# Patient Record
Sex: Female | Born: 1950 | Race: White | Hispanic: No | Smoking: Never smoker
Health system: Southern US, Community
[De-identification: ages and names within clinical notes are randomized; demographics above are authoritative.]

## PROBLEM LIST (undated history)

## (undated) DIAGNOSIS — E119 Type 2 diabetes mellitus without complications: Secondary | ICD-10-CM

## (undated) HISTORY — DX: Type 2 diabetes mellitus without complications: E11.9

---

## 1998-01-27 ENCOUNTER — Ambulatory Visit: Admission: RE | Admit: 1998-01-27 | Discharge: 1998-01-27 | Payer: Self-pay

## 1998-04-24 ENCOUNTER — Ambulatory Visit (HOSPITAL_COMMUNITY): Admission: RE | Admit: 1998-04-24 | Discharge: 1998-04-24 | Payer: Self-pay | Admitting: Family Medicine

## 1998-04-24 ENCOUNTER — Encounter: Payer: Self-pay | Admitting: Family Medicine

## 1998-05-16 ENCOUNTER — Ambulatory Visit (HOSPITAL_COMMUNITY): Admission: RE | Admit: 1998-05-16 | Discharge: 1998-05-16 | Payer: Self-pay | Admitting: Family Medicine

## 1998-05-30 ENCOUNTER — Ambulatory Visit (HOSPITAL_COMMUNITY): Admission: RE | Admit: 1998-05-30 | Discharge: 1998-05-30 | Payer: Self-pay | Admitting: Family Medicine

## 1998-05-31 ENCOUNTER — Encounter: Payer: Self-pay | Admitting: Family Medicine

## 1998-07-22 ENCOUNTER — Encounter: Payer: Self-pay | Admitting: Family Medicine

## 1998-07-22 ENCOUNTER — Ambulatory Visit (HOSPITAL_COMMUNITY): Admission: RE | Admit: 1998-07-22 | Discharge: 1998-07-22 | Payer: Self-pay | Admitting: Family Medicine

## 1998-10-18 ENCOUNTER — Encounter: Payer: Self-pay | Admitting: Family Medicine

## 1998-10-18 ENCOUNTER — Ambulatory Visit (HOSPITAL_COMMUNITY): Admission: RE | Admit: 1998-10-18 | Discharge: 1998-10-18 | Payer: Self-pay | Admitting: Family Medicine

## 1998-11-21 ENCOUNTER — Ambulatory Visit (HOSPITAL_COMMUNITY): Admission: RE | Admit: 1998-11-21 | Discharge: 1998-11-21 | Payer: Self-pay | Admitting: Family Medicine

## 1998-12-12 ENCOUNTER — Ambulatory Visit (HOSPITAL_COMMUNITY): Admission: RE | Admit: 1998-12-12 | Discharge: 1998-12-12 | Payer: Self-pay | Admitting: Family Medicine

## 1998-12-12 ENCOUNTER — Encounter: Payer: Self-pay | Admitting: Family Medicine

## 1999-02-24 ENCOUNTER — Ambulatory Visit (HOSPITAL_COMMUNITY): Admission: RE | Admit: 1999-02-24 | Discharge: 1999-02-24 | Payer: Self-pay | Admitting: Family Medicine

## 1999-02-24 ENCOUNTER — Encounter: Payer: Self-pay | Admitting: Family Medicine

## 1999-05-22 ENCOUNTER — Encounter: Payer: Self-pay | Admitting: Family Medicine

## 1999-05-22 ENCOUNTER — Ambulatory Visit (HOSPITAL_COMMUNITY): Admission: RE | Admit: 1999-05-22 | Discharge: 1999-05-22 | Payer: Self-pay | Admitting: Family Medicine

## 2000-05-11 ENCOUNTER — Ambulatory Visit (HOSPITAL_COMMUNITY): Admission: RE | Admit: 2000-05-11 | Discharge: 2000-05-11 | Payer: Self-pay | Admitting: *Deleted

## 2000-06-28 ENCOUNTER — Encounter: Admission: RE | Admit: 2000-06-28 | Discharge: 2000-06-28 | Payer: Self-pay | Admitting: Nephrology

## 2000-06-28 ENCOUNTER — Encounter: Payer: Self-pay | Admitting: Nephrology

## 2000-07-05 ENCOUNTER — Encounter: Admission: RE | Admit: 2000-07-05 | Discharge: 2000-10-03 | Payer: Self-pay | Admitting: Nephrology

## 2000-10-28 ENCOUNTER — Encounter: Admission: RE | Admit: 2000-10-28 | Discharge: 2001-01-26 | Payer: Self-pay | Admitting: Nephrology

## 2000-11-03 ENCOUNTER — Encounter: Payer: Self-pay | Admitting: Family Medicine

## 2000-11-03 ENCOUNTER — Encounter: Admission: RE | Admit: 2000-11-03 | Discharge: 2000-11-03 | Payer: Self-pay | Admitting: Family Medicine

## 2000-12-14 ENCOUNTER — Encounter: Admission: RE | Admit: 2000-12-14 | Discharge: 2001-03-14 | Payer: Self-pay

## 2001-03-23 ENCOUNTER — Encounter: Admission: RE | Admit: 2001-03-23 | Discharge: 2001-06-21 | Payer: Self-pay | Admitting: Nephrology

## 2001-05-11 ENCOUNTER — Ambulatory Visit (HOSPITAL_COMMUNITY): Admission: RE | Admit: 2001-05-11 | Discharge: 2001-05-11 | Payer: Self-pay | Admitting: Endocrinology

## 2001-05-31 ENCOUNTER — Ambulatory Visit (HOSPITAL_COMMUNITY): Admission: RE | Admit: 2001-05-31 | Discharge: 2001-05-31 | Payer: Self-pay | Admitting: Endocrinology

## 2001-05-31 ENCOUNTER — Encounter: Payer: Self-pay | Admitting: Endocrinology

## 2001-06-06 ENCOUNTER — Ambulatory Visit (HOSPITAL_COMMUNITY): Admission: RE | Admit: 2001-06-06 | Discharge: 2001-06-06 | Payer: Self-pay | Admitting: Endocrinology

## 2001-06-06 ENCOUNTER — Encounter: Payer: Self-pay | Admitting: Endocrinology

## 2001-07-15 ENCOUNTER — Encounter: Admission: RE | Admit: 2001-07-15 | Discharge: 2001-10-13 | Payer: Self-pay | Admitting: Nephrology

## 2001-08-29 ENCOUNTER — Encounter: Payer: Self-pay | Admitting: Family Medicine

## 2001-08-29 ENCOUNTER — Ambulatory Visit (HOSPITAL_COMMUNITY): Admission: RE | Admit: 2001-08-29 | Discharge: 2001-08-29 | Payer: Self-pay | Admitting: Family Medicine

## 2003-03-30 ENCOUNTER — Encounter: Admission: RE | Admit: 2003-03-30 | Discharge: 2003-03-30 | Payer: Self-pay | Admitting: Family Medicine

## 2003-03-30 ENCOUNTER — Encounter: Payer: Self-pay | Admitting: Family Medicine

## 2003-04-05 ENCOUNTER — Encounter: Payer: Self-pay | Admitting: Family Medicine

## 2003-04-05 ENCOUNTER — Encounter: Admission: RE | Admit: 2003-04-05 | Discharge: 2003-04-05 | Payer: Self-pay | Admitting: Family Medicine

## 2004-10-22 ENCOUNTER — Encounter: Admission: RE | Admit: 2004-10-22 | Discharge: 2004-10-22 | Payer: Self-pay | Admitting: Family Medicine

## 2004-10-28 ENCOUNTER — Encounter: Admission: RE | Admit: 2004-10-28 | Discharge: 2004-10-28 | Payer: Self-pay | Admitting: Family Medicine

## 2004-10-29 ENCOUNTER — Encounter: Admission: RE | Admit: 2004-10-29 | Discharge: 2004-10-29 | Payer: Self-pay | Admitting: Family Medicine

## 2006-01-11 ENCOUNTER — Encounter: Admission: RE | Admit: 2006-01-11 | Discharge: 2006-01-11 | Payer: Self-pay | Admitting: Family Medicine

## 2007-02-02 ENCOUNTER — Encounter: Admission: RE | Admit: 2007-02-02 | Discharge: 2007-02-02 | Payer: Self-pay | Admitting: Family Medicine

## 2008-03-02 ENCOUNTER — Encounter: Admission: RE | Admit: 2008-03-02 | Discharge: 2008-03-02 | Payer: Self-pay | Admitting: Family Medicine

## 2008-04-23 ENCOUNTER — Encounter: Admission: RE | Admit: 2008-04-23 | Discharge: 2008-04-23 | Payer: Self-pay | Admitting: Family Medicine

## 2010-01-01 ENCOUNTER — Encounter: Admission: RE | Admit: 2010-01-01 | Discharge: 2010-01-01 | Payer: Self-pay | Admitting: Family Medicine

## 2010-09-29 ENCOUNTER — Encounter: Payer: Self-pay | Admitting: Family Medicine

## 2014-12-20 ENCOUNTER — Ambulatory Visit (HOSPITAL_COMMUNITY): Payer: Medicare Other | Attending: Family Medicine | Admitting: Physical Therapy

## 2014-12-20 DIAGNOSIS — I890 Lymphedema, not elsewhere classified: Secondary | ICD-10-CM

## 2014-12-20 NOTE — Therapy (Signed)
[place], [place], [address] Phone: [phone]   Fax:  [phone]  Patient Details  Name: NIDYA BOUYER MRN: [id number] Date of Birth: 1951/09/04 Referring Provider:  Christain Sacramento, MD  Encounter Date: 12/20/2014 Rayetta Humphrey, PT CLT [phone] 12/20/2014, 11:05 AM   Pt to department with referral for lymphedema treatment.  Pt states she has been receiving lymphedema treatment from home health services since the end of September of 2015.  At this time the pt is still receiving [facility] services.  Therapist explained to pt that insurance will not cover both [place] and [facility] services.  Pt given the order sheet for short stretch bandages.  Pt is to call back if she decides that she would prefer to have lymphedema services as an out patient and stop home health services.   No charge for this visit.  Rayetta Humphrey, PT CLT [place] [address] [place], [place], [address] Phone: [phone]   Fax:  [phone]

## 2014-12-27 ENCOUNTER — Telehealth (HOSPITAL_COMMUNITY): Payer: Self-pay | Admitting: Physical Therapy

## 2014-12-27 ENCOUNTER — Ambulatory Visit (HOSPITAL_COMMUNITY): Payer: Medicare Other | Admitting: Physical Therapy

## 2014-12-27 DIAGNOSIS — I890 Lymphedema, not elsewhere classified: Secondary | ICD-10-CM

## 2014-12-27 NOTE — Therapy (Signed)
[place], [place], [address] Phone: [phone]   Fax:  [phone]  Physical Therapy Evaluation  Patient Details  Name: Stacey Miller MRN: [id number] Date of Birth: 1950-11-13 Referring Provider:  Christain Sacramento, MD  Encounter Date: 12/27/2014      PT End of Session - 12/27/14 1152    Visit Number 1   Number of Visits 24   Authorization Type Medicare    PT Start Time 0930   PT Stop Time 1025   PT Time Calculation (min) 55 min   Activity Tolerance Patient tolerated treatment well      No past medical history on file.  No past surgical history on file.  There were no vitals filed for this visit.  Visit Diagnosis:  Lymphedema      Subjective Assessment - 12/27/14 1202    Subjective Stacey Miller states that she has had "Big legs" for as long as she can remeber.  She states she was admitted into the hospital last September with wounds and significant swelling.  When she was discharged she had home health services who were completing wound care and compression wrapping ,(no foam or multilayer).  The wounds went away but her legs continue to swell especially in the thighs but the [facility] nurses stated that they did not know how to wrap her thighs,  Home health stopped seeing Stacey Miller on 12/24/2014 and she is now being referred to out-patient services.    Pertinent History Pt is in renal falure and is on six different diuretic;; pt was hospitalized 5 times last year for various complications of her lymphedema. Pt has pulmonary hypertension and is on oxygen; Pt fx Lt ankle 02/2014 decreased ROM ever since    Limitations Walking   How long can you sit comfortably? no problem   How long can you stand comfortably? less than five minutes    How long can you walk comfortably? five to ten minutes    Patient Stated Goals fluid in her legs to go down    Currently in Pain? No/denies            [facility] PT Assessment - 12/27/14 0001     Assessment   Medical Diagnosis lymphedema   Onset Date 05/22/14   Prior Therapy HH   Precautions   Precautions Other (comment)   Precaution Comments cellulitis    Restrictions   Weight Bearing Restrictions No   Balance Screen   Has the patient fallen in the past 6 months No   Has the patient had a decrease in activity level because of a fear of falling?  Yes   Is the patient reluctant to leave their home because of a fear of falling?  Yes   [place] Private residence   Living Arrangements Other (Comment)   Available Help at Discharge --  Pt has aide with her    Home Access Level entry   [place] One level   Prior Function   Level of Independence Needs assistance with homemaking   Vocation On disability   Leisure reading; tv   Cognition   Overall Cognitive Status Within Functional Limits for tasks assessed       Date 12/27/2014 12/27/2014   RT LT  MTP 23.00 23.00  ankle 25.4 26.2  4cm 26.60 29.00  8cm 32.80 33.90  12 cm 38.00 40.30  16cm 44.00 45.80  20cm 47.00 50.10  24cm 46.70 54.00  28cm 59.50 59.50  32cm 63.20 65.40  36cm 81.60 69.70  40cm 87.50 79.00  44cm 83.40 82.00  48cm    52cm    56cm    58cm    60cm        Sum of squares 39532.31 37993.89  Total Volume 12583.53 37943.276                 [facility] Adult PT Treatment/Exercise - 12/27/14 0001    Manual Therapy   Manual Therapy Edema management   Edema Management foam cut for B LE; LE cleansed, moisturized then wrapped using foam and multilayer compression bandages.                 PT Education - 12/27/14 1150    Education provided Yes   Education Details Pt given sheet for HEP for LE as well as informational sheet on lymphedema; explained to take bandages off if they are bothering her    Person(s) Educated Patient   Methods Explanation;Handout   Comprehension Verbalized understanding          PT Short Term Goals - 12/27/14 1210    PT SHORT  TERM GOAL #1   Title Pt to complete HEP   Time 1   Period Weeks   PT SHORT TERM GOAL #2   Title PT to be able to verbalize the signs and sx of cellulitis and the iimportance of getting antibiotics   Time 2   PT SHORT TERM GOAL #3   Title Pt volume to be decreased by 20%   Time 4   Period Weeks   PT SHORT TERM GOAL #4   Title Pt to be able to verbalize the care of compression bandages   Time 4   Period Weeks           PT Long Term Goals - 12/27/14 1211    PT LONG TERM GOAL #1   Title Pt volume to be decreased by 40%   Time 8   Period Weeks   PT LONG TERM GOAL #2   Title Pt to state she is able to walk for 10-15 minues    Time 8   Period Weeks   PT LONG TERM GOAL #3   Title Pt to be knowledgable of the maintainace stage of lymphedema and where to obtain compression garments   Time 8   Period Weeks               Plan - 12/27/14 1153    Clinical Impression Statement Pt is a 64 yo female with B lymphedema of LE.  She has a complicated hx including end stage renal disease.  Stacey Miller has significant edema in B LE especially in her thigh region.  She has been referred  and will benefit from skilled physical therapy for treatment of her lymphedema.     Pt will benefit from skilled therapeutic intervention in order to improve on the following deficits Obesity;Increased edema   Rehab Potential Good   PT Frequency 3x / week   PT Duration 8 weeks   PT Treatment/Interventions Compression bandaging;Patient/family education;Manual techniques   PT Next Visit Plan Due to end stage kidney failure we will progress slowly;  For the next treatment cut foam but do not wrapp B thigh area.  Complete manual decongestive techniques followed by compression bandaging of B LE to knee level.  Once pt is sure kidneys are doing well 2nd week Totally wrap one LE with the other being  wrapped to knee level only then third  week wrap entire leg B.    PT Home Exercise Plan given    Consulted and Agree  with Plan of Care Patient          G-Codes - 05-Jan-2015 1213    Functional Limitation Other PT primary   Other PT Primary Current Status (E2336) At least 60 percent but less than 80 percent impaired, limited or restricted   Other PT Primary Goal Status (P2244) At least 40 percent but less than 60 percent impaired, limited or restricted       Problem List There are no active problems to display for this patient.  Rayetta Humphrey, PT CLT [phone] 2015/01/05, 12:14 PM  [place] [address] [place], [place], [address] Phone: [phone]   Fax:  [phone]

## 2015-01-03 ENCOUNTER — Ambulatory Visit (HOSPITAL_COMMUNITY): Payer: Medicare Other | Admitting: Physical Therapy

## 2015-01-03 DIAGNOSIS — I890 Lymphedema, not elsewhere classified: Secondary | ICD-10-CM | POA: Diagnosis not present

## 2015-01-03 NOTE — Therapy (Signed)
[place], [place], [address] Phone: [phone]   Fax:  [phone]  Physical Therapy Treatment  Patient Details  Name: Stacey Miller MRN: [id number] Date of Birth: May 09, 1951 Referring Provider:  Christain Sacramento, MD  Encounter Date: 01/03/2015      PT End of Session - 01/03/15 0944    Visit Number 2   Number of Visits 24   Authorization Type Medicare    PT Start Time 0802   PT Stop Time 0930   PT Time Calculation (min) 88 min   Activity Tolerance Patient tolerated treatment well   Behavior During Therapy [facility] for tasks assessed/performed      No past medical history on file.  No past surgical history on file.  There were no vitals filed for this visit.  Visit Diagnosis:  Lymphedema      Subjective Assessment - 01/03/15 0935    Subjective Pt states that the scratches on her legs are from her scratching as well as from her cats.  Therapist explained the importance of keeping legs covered sot that cats will not scratch LE and for pt to rub and not scratch her own LE.  Pt's aide states that the company<([employer]) told her that they diid not carry short stretch bandages and that is why they have medium stretch bandages.  Therapist explained that this is not true and to call and talk to a manager if they need  to but to secure short stretch bandages.    Currently in Pain? No/denies             [facility] Adult PT Treatment/Exercise - 01/03/15 0001    Manual Therapy   Manual Therapy Edema management;Manual Lymphatic Drainage (MLD)   Edema Management foam cut for anterior Thigh B.     Manual Lymphatic Drainage (MLD) supraclavicular; deep and superficial abdominal followed by anterior  LE using inguinal-axillary anastomsis B followed by  Bilateral LE.  Both LE bandaged using foam and medium stretch bandages from foot to knee.                  PT Education - 01/03/15 0941    Education provided  Yes   Education Details Pt informed to avoid scratching LE due to increased risk of cellulitis, pt educated in the importance of keeping bandages clean,(pt comes to department with dirty, unwrapped bandages), The importance of securing short strech instead of medium stretch bandages and emphasised that after treatment pt needs to be willing to wear some type of compression bandaging on a daily basis.    Person(s) Educated Patient   Methods Explanation   Comprehension Verbalized understanding          PT Short Term Goals - 01/03/15 0948    PT SHORT TERM GOAL #1   Title Pt to complete HEP   Period Weeks   Status On-going   PT SHORT TERM GOAL #2   Title PT to be able to verbalize the signs and sx of cellulitis and the iimportance of getting antibiotics   Time 2   Period Weeks   PT SHORT TERM GOAL #3   Title Pt volume to be decreased by 20%   Time 4   Period Weeks   Status On-going           PT Long Term Goals - 01/03/15 0949    PT LONG TERM GOAL #1   Title Pt volume to be decreased by 40%  Time 8   Period Weeks   Status On-going   PT LONG TERM GOAL #2   Title Pt to state she is able to walk for 10-15 minues    Time 8   Period Weeks   Status On-going   PT LONG TERM GOAL #3   Title Pt to be knowledgable of the maintainace stage of lymphedema and where to obtain compression garments   Time 8   Period Weeks   Status On-going               Plan - 01/03/15 0945    Clinical Impression Statement Began manual decongestive techniques.  Will follow pt to make sure that she has not ill effects secondary to CHF and end stage renal failure. Pt advised that she has increased edema in her abdominal area as well and would benefit from compression in this area as well.    PT Next Visit Plan Assess how pt did with manual; cut posterior aspect for thighs but continue to only wrap to knee on next session.  Fourth session may begin wrapping one leg to thigh. Continue with manual  techniques.         Problem List There are no active problems to display for this patient.  Rayetta Humphrey, PT CLT [phone] 01/03/2015, 9:50 AM   [place] [address] [place], [place], [address] Phone: [phone]   Fax:  [phone]

## 2015-01-08 ENCOUNTER — Telehealth (HOSPITAL_COMMUNITY): Payer: Self-pay | Admitting: Physical Therapy

## 2015-01-08 ENCOUNTER — Ambulatory Visit (HOSPITAL_COMMUNITY): Payer: Medicare Other | Admitting: Physical Therapy

## 2015-01-10 ENCOUNTER — Ambulatory Visit (HOSPITAL_COMMUNITY): Payer: Medicare Other | Attending: Family Medicine | Admitting: Physical Therapy

## 2015-01-10 DIAGNOSIS — I890 Lymphedema, not elsewhere classified: Secondary | ICD-10-CM

## 2015-01-10 NOTE — Therapy (Signed)
[place], [place], [address] Phone: [phone]   Fax:  [phone]  Physical Therapy Treatment  Patient Details  Name: Stacey Miller MRN: [id number] Date of Birth: 10-07-1950 Referring Provider:  Christain Sacramento, MD  Encounter Date: 01/10/2015      PT End of Session - 01/10/15 1758    Visit Number 3   Number of Visits 24   Authorization Type Medicare    PT Start Time 1300   PT Stop Time 1435   PT Time Calculation (min) 95 min   Activity Tolerance Patient tolerated treatment well   Behavior During Therapy [facility] for tasks assessed/performed      No past medical history on file.  No past surgical history on file.  There were no vitals filed for this visit.  Visit Diagnosis:  Lymphedema      Subjective Assessment - 01/10/15 1755    Subjective Pt states her short stretch bandages have not come in yet.  Spoke to patients caregiver who states she has ordered them.                         [facility] Adult PT Treatment/Exercise - 01/10/15 1758    Manual Therapy   Manual Therapy Edema management;Manual Lymphatic Drainage (MLD)   Edema Management foam cut for ant/post thighs B.     Manual Lymphatic Drainage (MLD) supraclavicular; deep and superficial abdominal followed by anterior  LE using inguinal-axillary anastomsis B followed by LE.  Both LE bandaged using foam and medium stretch bandages from foot to knee.                  PT Education - 01/10/15 1806    Education provided Yes   Education Details Educated on relationship of lipidema to lymphedema and importance of decreasing weight along with current treatment for optimal results.  pt encouraged to keep cats away from her legs to prevent cellulitis from possible scratches.     Person(s) Educated Patient   Methods Explanation   Comprehension Verbalized understanding          PT Short Term Goals - 01/03/15 0948    PT SHORT TERM GOAL  #1   Title Pt to complete HEP   Period Weeks   Status On-going   PT SHORT TERM GOAL #2   Title PT to be able to verbalize the signs and sx of cellulitis and the iimportance of getting antibiotics   Time 2   Period Weeks   PT SHORT TERM GOAL #3   Title Pt volume to be decreased by 20%   Time 4   Period Weeks   Status On-going           PT Long Term Goals - 01/03/15 0949    PT LONG TERM GOAL #1   Title Pt volume to be decreased by 40%   Time 8   Period Weeks   Status On-going   PT LONG TERM GOAL #2   Title Pt to state she is able to walk for 10-15 minues    Time 8   Period Weeks   Status On-going   PT LONG TERM GOAL #3   Title Pt to be knowledgable of the maintainace stage of lymphedema and where to obtain compression garments   Time 8   Period Weeks   Status On-going  Plan - 01/10/15 1800    Clinical Impression Statement Continued manual decongestive techniques.  Educated patient on lymphedema during manual.  Completed bandaging to bilateral LE's foot to knee using 1/2" foam, medium stretch and 2 layers of 10cm short stretch on each LE. Doubled medium stretch to decrease width for wrapping foot and ankle.   Also completed toe bandaging today.  Pt reported overall comfort following session without noted impairments with gait due to bandaging.    Posterior thigh foam pieces cut to begin use next visit if patient tolerates todays session well.    PT Next Visit Plan Next session may begin wrapping one leg to thigh ( Right as this one is worse). Continue with manual techniques.         Problem List There are no active problems to display for this patient.  Teena Irani, PTA/CLT [phone] 01/10/2015, 6:13 PM  [place] [address] [place], [place], [address] Phone: [phone]   Fax:  [phone]

## 2015-01-14 ENCOUNTER — Ambulatory Visit (HOSPITAL_COMMUNITY): Payer: Medicare Other | Admitting: Physical Therapy

## 2015-01-14 DIAGNOSIS — I890 Lymphedema, not elsewhere classified: Secondary | ICD-10-CM | POA: Diagnosis not present

## 2015-01-14 NOTE — Therapy (Signed)
[place], [place], [address] Phone: [phone]   Fax:  [phone]  Physical Therapy Treatment  Patient Details  Name: Stacey Miller MRN: [id number] Date of Birth: 1950/09/11 Referring Provider:  Christain Sacramento, MD  Encounter Date: 01/14/2015      PT End of Session - 01/14/15 1621    Visit Number 4   Number of Visits 24   Authorization Type Medicare    PT Start Time 2585   PT Stop Time 1605   PT Time Calculation (min) 90 min   Activity Tolerance Patient tolerated treatment well   Behavior During Therapy [facility] for tasks assessed/performed      No past medical history on file.  No past surgical history on file.  There were no vitals filed for this visit.  Visit Diagnosis:  Lymphedema      Subjective Assessment - 01/14/15 1616    Subjective Pt comes today with box of short stretch she received in the mail.  Pt states she is doing well without c/o pain and bandages were comfortable.    Currently in Pain? No/denies                         [facility] Adult PT Treatment/Exercise - 01/14/15 0001    Manual Therapy   Manual Therapy Edema management;Manual Lymphatic Drainage (MLD)   Edema Management for bilateral LE's   Manual Lymphatic Drainage (MLD) supraclavicular; deep and superficial abdominal followed by anterior inguinal-axillary anastomsis B. Lt LE bandaged toes to knee, Rt LE bandaged toes to hip using 1/2" foam and multilayer short stretch bandages.                PT Education - 01/14/15 1624    Education provided Yes   Education Details PT instructed to remove Rt upper thigh bandaging if becomes uncomfortable or has difficulty breathing.    Person(s) Educated Patient   Methods Explanation   Comprehension Verbalized understanding          PT Short Term Goals - 01/03/15 0948    PT SHORT TERM GOAL #1   Title Pt to complete HEP   Period Weeks   Status On-going   PT SHORT  TERM GOAL #2   Title PT to be able to verbalize the signs and sx of cellulitis and the iimportance of getting antibiotics   Time 2   Period Weeks   PT SHORT TERM GOAL #3   Title Pt volume to be decreased by 20%   Time 4   Period Weeks   Status On-going           PT Long Term Goals - 01/03/15 0949    PT LONG TERM GOAL #1   Title Pt volume to be decreased by 40%   Time 8   Period Weeks   Status On-going   PT LONG TERM GOAL #2   Title Pt to state she is able to walk for 10-15 minues    Time 8   Period Weeks   Status On-going   PT LONG TERM GOAL #3   Title Pt to be knowledgable of the maintainace stage of lymphedema and where to obtain compression garments   Time 8   Period Weeks   Status On-going               Plan - 01/14/15 1621    Clinical Impression Statement completed manual decongestive techniques for Bilateral  LE's followed by bandaging Bilateral LE's.  Completed upper Rt thigh in addition to bilateral distal LE's.  PT reported overall comfort.  Good results with toe bandaging so continued this as well.  Pt instructed to remove upper thigh bandages if increases pain or becomes difficult to breathe.       PT Next Visit Plan Continue with manual techniques.  Remeasure next visit.         Problem List There are no active problems to display for this patient.   Teena Irani, PTA/CLT [phone] 01/14/2015, 4:25 PM  [place] [address] [place], [place], [address] Phone: [phone]   Fax:  [phone]

## 2015-01-16 ENCOUNTER — Ambulatory Visit (HOSPITAL_COMMUNITY): Payer: Medicare Other | Admitting: Physical Therapy

## 2015-01-16 DIAGNOSIS — I890 Lymphedema, not elsewhere classified: Secondary | ICD-10-CM | POA: Diagnosis not present

## 2015-01-16 NOTE — Therapy (Signed)
[place], [place], [address] Phone: [phone]   Fax:  [phone]  Physical Therapy Treatment  Patient Details  Name: Stacey Miller MRN: [id number] Date of Birth: [birth year] Referring Provider:  Christain Sacramento, MD  Encounter Date: 01/16/2015      PT End of Session - 01/16/15 1545    Visit Number 5   Number of Visits 24   Date for PT Re-Evaluation 01/24/15   Authorization Type Medicare    Authorization - Visit Number 5   Authorization - Number of Visits 10   PT Start Time 1330   PT Stop Time 1530   PT Time Calculation (min) 120 min   Activity Tolerance Patient tolerated treatment well   Behavior During Therapy [facility] for tasks assessed/performed      No past medical history on file.  No past surgical history on file.  There were no vitals filed for this visit.  Visit Diagnosis:  Lymphedema      Subjective Assessment - 01/16/15 1559    Subjective Pt states the bandages were comfortable and the one on her Rt thigh did not slip.  States she can tell her legs are smaller, especially her ankles.    Currently in Pain? No/denies                         [facility] Adult PT Treatment/Exercise - 01/16/15 1554    Manual Therapy   Manual Therapy Edema management;Manual Lymphatic Drainage (MLD)   Edema Management for bilateral LE's   Manual Lymphatic Drainage (MLD) supraclavicular; deep and superficial abdominal followed by anterior inguinal-axillary anastomsis B. Lt LE bandaged toes to knee, Rt LE bandaged toes to hip using 1/2" foam and multilayer short stretch bandages.          right left  MTP 23.00 23.20  ankle 29 30  4cm 24.50 26.50  8cm 25.00 29.70  12 cm 32.20 36.00  16cm 37.50 41.00  20cm 41.60 46.80  24cm 44.60 49.00  28cm 48.00 51.00  32cm 47.00 55.00  36cm 57.00 56.50  40cm 58.50 68.90  44cm 74.00 72.50  48cm 85.00 74.00  52cm 83.50 77.00  56cm 82.00 81.00               Sum of squares 46339.56 47378.53  Total Volume 14750.35 15081.06            PT Education - 01/16/15 1601    Education provided Yes   Education Details Compression tanks and shorts education; given print out of items on [email or web address] website   Person(s) Educated Patient   Methods Explanation;Handout   Comprehension Verbalized understanding          PT Short Term Goals - 01/03/15 0948    PT SHORT TERM GOAL #1   Title Pt to complete HEP   Period Weeks   Status On-going   PT SHORT TERM GOAL #2   Title PT to be able to verbalize the signs and sx of cellulitis and the iimportance of getting antibiotics   Time 2   Period Weeks   PT SHORT TERM GOAL #3   Title Pt volume to be decreased by 20%   Time 4   Period Weeks   Status On-going           PT Long Term Goals - 01/03/15 0949    PT LONG TERM GOAL #1   Title Pt volume to be decreased by  40%   Time 8   Period Weeks   Status On-going   PT LONG TERM GOAL #2   Title Pt to state she is able to walk for 10-15 minues    Time 8   Period Weeks   Status On-going   PT LONG TERM GOAL #3   Title Pt to be knowledgable of the maintainace stage of lymphedema and where to obtain compression garments   Time 8   Period Weeks   Status On-going               Plan - 01/16/15 1548    Clinical Impression Statement Continued with complete decongestive therapy for bilateral LE's.  bilateral LE's remeasured today from MTP to 44cm and measured 3 more locations up to 56 cm since we have began bandaging to thight level.  Pt with reduction of 4,492.61 cc from Rt and 2,731.53 cc from Lt.  this is a total of 7,224.14cc compined equivalent to approx 16 lbs of fluid weight.  According to measurements, Rt thigh is larger than Lt and Lt distal LE is larger than Rt.  Overall patient is responding well to present therapy.  Educated patient on compression shorts and tank tops to help decompress fluid in these areas.  Researched these items on the  [employer] website for patient and printed them for her. continued bandaging entire Rt LE and to knee level only on Lt LE.     PT Next Visit Plan Continue with complete decongestive therapy.  May begin bandaging bilateral LE's to thigh next week if patient is able to tolerate.  continue to measure weekly (Wednesday or Friday).          Teena Irani, PTA/CLT [phone] 01/16/2015, 4:05 PM  [facility] [address] [place], [place], [address] Phone: [phone]   Fax:  [phone]

## 2015-01-21 ENCOUNTER — Ambulatory Visit (HOSPITAL_COMMUNITY): Payer: Medicare Other | Admitting: Physical Therapy

## 2015-01-21 DIAGNOSIS — I890 Lymphedema, not elsewhere classified: Secondary | ICD-10-CM

## 2015-01-21 NOTE — Therapy (Signed)
[place], [place], [address] Phone: [phone]   Fax:  [phone]  Physical Therapy Treatment  Patient Details  Name: Stacey Miller MRN: [id number] Date of Birth: 08/25/1951 Referring Provider:  Christain Sacramento, MD  Encounter Date: 01/21/2015      PT End of Session - 01/21/15 1722    Visit Number 6   Number of Visits 24   Date for PT Re-Evaluation 01/24/15   Authorization Type Medicare    Authorization - Visit Number 6   Authorization - Number of Visits 10   PT Start Time 1400   PT Stop Time 1530   PT Time Calculation (min) 90 min      No past medical history on file.  No past surgical history on file.  There were no vitals filed for this visit.  Visit Diagnosis:  Lymphedema      Subjective Assessment - 01/21/15 1721    Subjective Pt states she is still voiding a lot.              [facility] Adult PT Treatment/Exercise - 01/21/15 0001    Manual Therapy   Manual Therapy Edema management;Manual Lymphatic Drainage (MLD)   Edema Management for bilateral LE's   Manual Lymphatic Drainage (MLD) supraclavicular; deep and superficial abdominal followed by anterior inguinal-axillary anastomsis B. Lt LE bandaged toes to knee, Rt LE bandaged toes to hip using 1/2" foam and multilayer short stretch bandages.              PT Short Term Goals - 01/03/15 0948    PT SHORT TERM GOAL #1   Title Pt to complete HEP   Period Weeks   Status On-going   PT SHORT TERM GOAL #2   Title PT to be able to verbalize the signs and sx of cellulitis and the iimportance of getting antibiotics   Time 2   Period Weeks   PT SHORT TERM GOAL #3   Title Pt volume to be decreased by 20%   Time 4   Period Weeks   Status On-going           PT Long Term Goals - 01/03/15 0949    PT LONG TERM GOAL #1   Title Pt volume to be decreased by 40%   Time 8   Period Weeks   Status On-going   PT LONG TERM GOAL #2   Title Pt  to state she is able to walk for 10-15 minues    Time 8   Period Weeks   Status On-going   PT LONG TERM GOAL #3   Title Pt to be knowledgable of the maintainace stage of lymphedema and where to obtain compression garments   Time 8   Period Weeks   Status On-going               Plan - 01/21/15 1722    Clinical Impression Statement Pt forgot bandages  by the time care taker had returned with bandages there was only time to compression wrap from toes to knees B.    PT Next Visit Plan continue to wrap full LE bilaterally         Problem List There are no active problems to display for this patient. Rayetta Humphrey, PT CLT [phone] 01/21/2015, 5:27 PM  [facility] [address] [place], [place], [address] Phone: [phone]   Fax:  [phone]

## 2015-01-23 ENCOUNTER — Ambulatory Visit (HOSPITAL_COMMUNITY): Payer: Medicare Other | Admitting: Physical Therapy

## 2015-01-25 ENCOUNTER — Ambulatory Visit (HOSPITAL_COMMUNITY): Payer: Medicare Other | Admitting: Physical Therapy

## 2015-01-25 DIAGNOSIS — I890 Lymphedema, not elsewhere classified: Secondary | ICD-10-CM | POA: Diagnosis not present

## 2015-01-25 NOTE — Therapy (Signed)
[place], [place], [address] Phone: [phone]   Fax:  [phone]  Physical Therapy Treatment  Patient Details  Name: Stacey Miller MRN: [id number] Date of Birth: 1951-08-13 Referring Provider:  Christain Sacramento, MD  Encounter Date: 01/25/2015      PT End of Session - 01/25/15 1630    Visit Number 7   Number of Visits 24   Date for PT Re-Evaluation 01/07/15   Authorization Type Medicare    Authorization - Visit Number 7   Authorization - Number of Visits 10   PT Start Time 4081   PT Stop Time 1432   PT Time Calculation (min) 87 min      No past medical history on file.  No past surgical history on file.  There were no vitals filed for this visit.  Visit Diagnosis:  Lymphedema      Subjective Assessment - 01/25/15 1626    Subjective Pt states that she is able to keep her bandages on until the day prior to comimg to therapy.             [facility] Adult PT Treatment/Exercise - 01/25/15 0001    Manual Therapy   Manual Therapy Edema management;Manual Lymphatic Drainage (MLD)   Edema Management for bilateral LE's   Manual Lymphatic Drainage (MLD) supraclavicular; deep and superficial abdominal followed by anterior inguinal-axillary anastomsis B. B LE bandaged toes to hip using...Marland KitchenMarland Kitchen. 1/2" foam and multilayer short stretch bandages.                PT Education - 01/25/15 1628    Education provided Yes   Education Details Again instructed pt on the importance of obtaining compression for her trunk;  Explain the benefit of purchasing a second set of short stretch bandages    Person(s) Educated Patient   Methods Explanation   Comprehension Verbalized understanding          PT Short Term Goals - 01/03/15 0948    PT SHORT TERM GOAL #1   Title Pt to complete HEP   Period Weeks   Status On-going   PT SHORT TERM GOAL #2   Title PT to be able to verbalize the signs and sx of cellulitis and the  iimportance of getting antibiotics   Time 2   Period Weeks   PT SHORT TERM GOAL #3   Title Pt volume to be decreased by 20%   Time 4   Period Weeks   Status On-going           PT Long Term Goals - 01/03/15 0949    PT LONG TERM GOAL #1   Title Pt volume to be decreased by 40%   Time 8   Period Weeks   Status On-going   PT LONG TERM GOAL #2   Title Pt to state she is able to walk for 10-15 minues    Time 8   Period Weeks   Status On-going   PT LONG TERM GOAL #3   Title Pt to be knowledgable of the maintainace stage of lymphedema and where to obtain compression garments   Time 8   Period Weeks   Status On-going               Plan - 01/25/15 1631    Clinical Impression Statement PT with noted decreased swelling from toes to knee area but continues to have fluid from knee to thigh.  Pt verbalized completing LE  exercises to promote lymph drainage at home.    PT Next Visit Plan continue to wrap full LE bilaterally ; Remeasure pt next visit .         Problem List There are no active problems to display for this patient.  Rayetta Humphrey, PT CLT [phone] 01/25/2015, 4:33 PM  [facility] [address] [place], [place], [address] Phone: [phone]   Fax:  [phone]

## 2015-01-29 ENCOUNTER — Ambulatory Visit (HOSPITAL_COMMUNITY): Payer: Medicare Other | Admitting: Physical Therapy

## 2015-01-29 DIAGNOSIS — I890 Lymphedema, not elsewhere classified: Secondary | ICD-10-CM

## 2015-01-29 NOTE — Therapy (Signed)
[place], [place], [address] Phone: [phone]   Fax:  [phone]  Physical Therapy Treatment  Patient Details  Name: Stacey Miller MRN: [id number] Date of Birth: [birth year] Referring Provider:  Christain Sacramento, MD  Encounter Date: 01/29/2015      PT End of Session - 01/29/15 1806    Visit Number 8   Number of Visits 24   Date for PT Re-Evaluation 01/07/15   Authorization Type Medicare    Authorization - Visit Number 8   Authorization - Number of Visits 10   PT Start Time 1638   PT Stop Time 1700   PT Time Calculation (min) 90 min      No past medical history on file.  No past surgical history on file.  There were no vitals filed for this visit.  Visit Diagnosis:  Lymphedema      Subjective Assessment - 01/29/15 1805    Subjective Pt states she removed her bandages last night and took a shower.  STates she had to put one of her cats down this morning and it was hard for her.  currently without pain.   Currently in Pain? No/denies                [facility] Adult PT Treatment/Exercise - 01/29/15 1806    Manual Therapy   Manual Therapy Edema management;Manual Lymphatic Drainage (MLD)   Edema Management for bilateral LE's   Manual Lymphatic Drainage (MLD) supraclavicular; deep and superficial abdominal followed by anterior inguinal-axillary anastomsis B. B LE bandaged toes to hip using...Marland KitchenMarland Kitchen. 1/2" foam and multilayer short stretch bandages.              PT Short Term Goals - 01/03/15 0948    PT SHORT TERM GOAL #1   Title Pt to complete HEP   Period Weeks   Status On-going   PT SHORT TERM GOAL #2   Title PT to be able to verbalize the signs and sx of cellulitis and the iimportance of getting antibiotics   Time 2   Period Weeks   PT SHORT TERM GOAL #3   Title Pt volume to be decreased by 20%   Time 4   Period Weeks   Status On-going           PT Long Term Goals - 01/03/15 0949     PT LONG TERM GOAL #1   Title Pt volume to be decreased by 40%   Time 8   Period Weeks   Status On-going   PT LONG TERM GOAL #2   Title Pt to state she is able to walk for 10-15 minues    Time 8   Period Weeks   Status On-going   PT LONG TERM GOAL #3   Title Pt to be knowledgable of the maintainace stage of lymphedema and where to obtain compression garments   Time 8   Period Weeks   Status On-going               Plan - 01/29/15 1807    Clinical Impression Statement Pt with decongestion of bilateral LE's  with reports of slippage of Rt thigh bandage within a few days.  Pt remains compliant with keeping bandages intact.  PT has not been able to wash bandages this week due to her washing machine being broke.  Pt with very little induration in upper thighs and may be more soft tissue present than actual lymph fluid.  Will measure next visit.    PT Next Visit Plan continue to wrap full LE bilaterally ; Remeasure pt next visit .         Problem List There are no active problems to display for this patient.   Teena Irani, PTA/CLT [phone]  01/29/2015, 6:11 PM  [facility] [address] [place], [place], [address] Phone: [phone]   Fax:  [phone]

## 2015-01-31 ENCOUNTER — Ambulatory Visit (HOSPITAL_COMMUNITY): Payer: Medicare Other | Admitting: Physical Therapy

## 2015-01-31 DIAGNOSIS — I890 Lymphedema, not elsewhere classified: Secondary | ICD-10-CM

## 2015-01-31 NOTE — Therapy (Signed)
[place], [place], [address] Phone: [phone]   Fax:  [phone]  Physical Therapy Treatment  Patient Details  Name: Stacey Miller MRN: [id number] Date of Birth: 1951-06-09 Referring Provider:  Christain Sacramento, MD  Encounter Date: 01/31/2015      PT End of Session - 01/31/15 1811    Visit Number 9   Number of Visits 24   Date for PT Re-Evaluation 01/07/15   Authorization Type Medicare    Authorization - Visit Number 9   Authorization - Number of Visits 10   PT Start Time 1520   PT Stop Time 1710   PT Time Calculation (min) 110 min   Activity Tolerance Patient tolerated treatment well   Behavior During Therapy [facility] for tasks assessed/performed      No past medical history on file.  No past surgical history on file.  There were no vitals filed for this visit.  Visit Diagnosis:  Lymphedema      Subjective Assessment - 01/31/15 1803    Subjective Pt states she just got her washer fixed today so plans on washing her bandages before her next visit. PT also states she has gained 10 lbs of fluid over the past week and plans on taking her high dose fluid pill today   Currently in Pain? No/denies               Date 01/31/2015    right left  MTP 23.00 23.00  ankle 29 30  4cm 26.80 27.20  8cm 27.80 32.10  12 cm 33.60 39.20  16cm 39.00 45.00  20cm 42.50 48.00  24cm 44.50 51.00  28cm 48.00 53.80  32cm 49.60 56.00  36cm 58.80 60.00  40cm 60.00 67.80  44cm 80.20 74.00  48cm 83.20 76.00  52cm 84.60 77.00  56cm 84.60 79.00              Sum of squares 48787.74 49315.17  Total Volume 15529.63 15697.51               [facility] Adult PT Treatment/Exercise - 01/31/15 1810    Manual Therapy   Manual Therapy Edema management;Manual Lymphatic Drainage (MLD)   Edema Management for bilateral LE's   Manual Lymphatic Drainage (MLD) supraclavicular; deep and superficial abdominal followed by  anterior inguinal-axillary anastomsis B. B LE bandaged toes to hip using...Marland KitchenMarland Kitchen. 1/2" foam and multilayer short stretch bandages.                  PT Short Term Goals - 01/31/15 1817    PT SHORT TERM GOAL #1   Title Pt to complete HEP   Time 1   Period Weeks   Status Achieved   PT SHORT TERM GOAL #2   Title PT to be able to verbalize the signs and sx of cellulitis and the iimportance of getting antibiotics   Time 2   Period Weeks   Status Achieved   PT SHORT TERM GOAL #3   Title Pt volume to be decreased by 20%   Time 4   Period Weeks   Status Achieved   PT SHORT TERM GOAL #4   Title Pt to be able to verbalize the care of compression bandages   Time 4   Period Weeks   Status Achieved           PT Long Term Goals - 01/31/15 1817    PT LONG TERM GOAL #1   Title Pt volume  to be decreased by 40%   Time 8   Period Weeks   Status On-going   PT LONG TERM GOAL #2   Title Pt to state she is able to walk for 10-15 minues    Time 8   Period Weeks   Status On-going   PT LONG TERM GOAL #3   Title Pt to be knowledgable of the maintainace stage of lymphedema and where to obtain compression garments   Time 8   Period Weeks   Status On-going               Plan - 01/31/15 1814    Clinical Impression Statement Pt remeasured today with overall increase of 779.28 cc on Rt and 616.45 cc on Lt.  Unsure of cause of fluid gain today, however still much lower than when began therapy.  PT instructed to continue to monitor her weight daily.     PT Next Visit Plan continue to wrap full LE bilaterally ; gcodes next visit.         Teena Irani, PTA/CLT [phone] 01/31/2015, 6:20 PM  [facility] [address] [place], [place], [address] Phone: [phone]   Fax:  [phone]

## 2015-02-06 ENCOUNTER — Telehealth (HOSPITAL_COMMUNITY): Payer: Self-pay

## 2015-02-06 NOTE — Telephone Encounter (Signed)
6/1 called to see if she could come in on 6/3 for Amy to see and she said she had a drs appt and would just see [place] next week

## 2015-02-11 ENCOUNTER — Ambulatory Visit (HOSPITAL_COMMUNITY): Payer: Medicare Other | Attending: Family Medicine | Admitting: Physical Therapy

## 2015-02-11 DIAGNOSIS — I890 Lymphedema, not elsewhere classified: Secondary | ICD-10-CM | POA: Diagnosis present

## 2015-02-11 NOTE — Therapy (Addendum)
[place], [place], [address] Phone: [phone]   Fax:  [phone]  Physical Therapy Treatment / Dareen Piano update  Patient Details  Name: Stacey Miller MRN: [id number] Date of Birth: 1950/12/02 Referring Provider:  Christain Sacramento, MD  Encounter Date: 02/11/2015      PT End of Session - 02/11/15 1811    Visit Number 10   Number of Visits 24   Date for PT Re-Evaluation 01/28/15   Authorization Type Medicare    Authorization - Visit Number 10   Authorization - Number of Visits 20   PT Start Time 1245   PT Stop Time 1515   PT Time Calculation (min) 80 min   Activity Tolerance Patient tolerated treatment well   Behavior During Therapy [facility] for tasks assessed/performed      No past medical history on file.  No past surgical history on file.  There were no vitals filed for this visit.  Visit Diagnosis:  Lymphedema      Subjective Assessment - 02/11/15 1802    Subjective Pt states she had a lot of other MD appoitnments last week and that is why she couldn't come to therapy.   PT states she continues to gain fluid and doesn't know why.  PT reports she has gained 7# in last 2 weeks.  Also with increased redness around ankles today and several opened areas on bilateral Upper thighs.     Currently in Pain? No/denies                         [facility] Adult PT Treatment/Exercise - 02/11/15 1804    Manual Therapy   Manual Therapy Edema management;Manual Lymphatic Drainage (MLD)   Edema Management for bilateral LE's   Manual Lymphatic Drainage (MLD) Only conncentrationon on inguinal nodes followed by LE bandaged knee using  1/2" foam and multilayer short stretch bandages.                  PT Short Term Goals - 01/31/15 1817    PT SHORT TERM GOAL #1   Title Pt to complete HEP   Time 1   Period Weeks   Status Achieved   PT SHORT TERM GOAL #2   Title PT to be able to verbalize the signs and sx  of cellulitis and the iimportance of getting antibiotics   Time 2   Period Weeks   Status Achieved   PT SHORT TERM GOAL #3   Title Pt volume to be decreased by 20%   Time 4   Period Weeks   Status Achieved   PT SHORT TERM GOAL #4   Title Pt to be able to verbalize the care of compression bandages   Time 4   Period Weeks   Status Achieved           PT Long Term Goals - 01/31/15 1817    PT LONG TERM GOAL #1   Title Pt volume to be decreased by 40%   Time 8   Period Weeks   Status On-going   PT LONG TERM GOAL #2   Title Pt to state she is able to walk for 10-15 minues    Time 8   Period Weeks   Status On-going   PT LONG TERM GOAL #3   Title Pt to be knowledgable of the maintainace stage of lymphedema and where to obtain compression garments   Time 8  Period Weeks   Status On-going               Plan - 02/16/2015 1806    Clinical Impression Statement Pt gaining fluid without know cause, especially into her abdominal region.  Also with noted redness around ankle with minimal warmth.  No streaking or other signs of infection present.  Pt also with area of breakdown on Rt inner thigh, unsure if from skin friction or from bandages.  Area on outside of Lt outer thigh and Rt outer proximal LE.  Dressing with xeroform and medipore tape placed over rt inner thigh wound to prevent skin friction and further irritation.  Pt instructed to go to ED if begins to run a temp, chills, nausea, et,c.  Pt verbalized understanding.  Pt only received shortened version of manual for bilateral LE's and bandages to knee level only.     PT Next Visit Plan continue to wrap full LE bilaterally if swelling is down.  PT returns to MD tomorrow regarding fluid gain.         Problem List There are no active problems to display for this patient.   Teena Irani, PTA/CLT [phone] 2015/02/17, 4:22 PM       G-Codes - 02/16/15 0846    Functional Limitation Other PT primary   Other PT Primary  Current Status [phone]) At least 60 percent but less than 80 percent impaired, limited or restricted   Other PT Primary Goal Status (W1027) At least 40 percent but less than 60 percent impaired, limited or restricted     Deniece Ree PT, DPT [facility] [place], [place], [address] Phone: [phone]   Fax:  [phone]

## 2015-02-13 ENCOUNTER — Ambulatory Visit (HOSPITAL_COMMUNITY): Payer: Medicare Other | Admitting: Physical Therapy

## 2015-02-13 DIAGNOSIS — I890 Lymphedema, not elsewhere classified: Secondary | ICD-10-CM | POA: Diagnosis not present

## 2015-02-13 NOTE — Therapy (Signed)
[facility], [place], [address] Phone: [phone]   Fax:  [phone]  Physical Therapy Treatment  Patient Details  Name: TREVIA NOP MRN: [id number] Date of Birth: March 12, 1951 Referring Provider:  Christain Sacramento, MD  Encounter Date: 02/13/2015      PT End of Session - 02/13/15 1749    Date for PT Re-Evaluation 02/18/15      No past medical history on file.  No past surgical history on file.  There were no vitals filed for this visit.  Visit Diagnosis:  Lymphedema      Subjective Assessment - 02/13/15 1734    Subjective Pt states her legs feel better and MD was not concerned wtih her weight gain in the past 2 weeks.  PT states MD was happy that she was 10# lighter overall since beginning of year.     Currently in Pain? No/denies                         [facility] Adult PT Treatment/Exercise - 02/13/15 1745    Manual Therapy   Manual Therapy Edema management;Manual Lymphatic Drainage (MLD)   Edema Management for bilateral LE's   Manual Lymphatic Drainage (MLD) supraclavicular; deep and superficial abdominal followed by anterior inguinal-axillary anastomsis B. B LE bandaged toes to hip using...Marland KitchenMarland Kitchen. 1/2" foam and multilayer short stretch bandages.                  PT Short Term Goals - 01/31/15 1817    PT SHORT TERM GOAL #1   Title Pt to complete HEP   Time 1   Period Weeks   Status Achieved   PT SHORT TERM GOAL #2   Title PT to be able to verbalize the signs and sx of cellulitis and the iimportance of getting antibiotics   Time 2   Period Weeks   Status Achieved   PT SHORT TERM GOAL #3   Title Pt volume to be decreased by 20%   Time 4   Period Weeks   Status Achieved   PT SHORT TERM GOAL #4   Title Pt to be able to verbalize the care of compression bandages   Time 4   Period Weeks   Status Achieved           PT Long Term Goals - 01/31/15 1817    PT LONG TERM GOAL  #1   Title Pt volume to be decreased by 40%   Time 8   Period Weeks   Status On-going   PT LONG TERM GOAL #2   Title Pt to state she is able to walk for 10-15 minues    Time 8   Period Weeks   Status On-going   PT LONG TERM GOAL #3   Title Pt to be knowledgable of the maintainace stage of lymphedema and where to obtain compression garments   Time 8   Period Weeks   Status On-going               Plan - 02/13/15 1746    Clinical Impression Statement Rsumed full manual lymph drainage for bilateral LE's.  Overall improved skin integrity without broken skin, drainage, redness or heat.  Bandaged bilateral LE's full thigh today.     PT Next Visit Plan continue to complete decongestive therapy.  Re-measure/ re-evaluate and begin assigning KX with charges        Problem List There are  no active problems to display for this patient.   Teena Irani, PTA/CLT [phone]  02/13/2015, 5:54 PM  [facility] [address] [place], [place], [address] Phone: [phone]   Fax:  [phone]

## 2015-02-18 ENCOUNTER — Ambulatory Visit (HOSPITAL_COMMUNITY): Payer: Medicare Other | Admitting: Physical Therapy

## 2015-02-18 DIAGNOSIS — I890 Lymphedema, not elsewhere classified: Secondary | ICD-10-CM | POA: Diagnosis not present

## 2015-02-18 NOTE — Therapy (Signed)
[place], [place], [address] Phone: [phone]   Fax:  [phone]  Physical Therapy Treatment  Patient Details  Name: Stacey Miller MRN: [id number] Date of Birth: 1951-05-14 Referring Provider:  Christain Sacramento, MD  Encounter Date: 02/18/2015      PT End of Session - 02/18/15 1634    Visit Number 12   Number of Visits 24   Date for PT Re-Evaluation 03/20/15   Authorization Type Medicare    Authorization - Visit Number 12   Authorization - Number of Visits 20   PT Start Time 0354   PT Stop Time 1440   PT Time Calculation (min) 92 min   Activity Tolerance Patient tolerated treatment well   Behavior During Therapy [facility] for tasks assessed/performed      No past medical history on file.  No past surgical history on file.  There were no vitals filed for this visit.  Visit Diagnosis:  No diagnosis found.      Subjective Assessment - 02/18/15 1631    Subjective Pt states she fell getting from her wheelchair to her lift chair on Saturday and is very sore.  Pt states she has too many MD appointments and wants to decrease treatment to twice a week.    Currently in Pain? No/denies           Date 12/27/2014 12/27/2014 02/18/2015 02/18/2015   RT LT Rt LT  MTP 23.00 23.00 23 24  ankle 25.4 26.2 29.50 31.00  4cm 26.60 29.00 27.40 29.70  8cm 32.80 33.90 27.50 31.80  12 cm 38.00 40.30 32.90 39.00  16cm 44.00 45.80 40.00 44.90  20cm 47.00 50.10 43.50 50.10  24cm 46.70 54.00 45.70 52.70  28cm 59.50 59.50 49.50 53.30  32cm 63.20 65.40 49.20 58.10  36cm 81.60 69.70 57.00 66.00  40cm 87.50 79.00 61.00 68.20  44cm 83.40 82.00 81.00 77.50  48cm      52cm      56cm      58cm      60cm            Sum of squares 39532.31 37993.89 27971.30 33484.63  Total Volume 12583.53 65681.275 8903.5445 10658.493    Rt LE total volume loss since initial treatment 3,680 cc; Lt 1435 cc.          [facility] Adult PT  Treatment/Exercise - 02/18/15 0001    Manual Therapy   Manual Therapy Edema management;Manual Lymphatic Drainage (MLD)   Edema Management for bilateral LE's   Manual Lymphatic Drainage (MLD) supraclavicular; deep and superficial abdominal followed by anterior inguinal-axillary anastomsis B. B LE bandaged toes to hip using...Marland KitchenMarland Kitchen. 1/2" foam and multilayer short stretch bandages.            PT Education - 02/18/15 1633    Education provided Yes   Education Details Pt instructed that it is not good for her to stay in her lift chair all day as she has been; encouaged to complete sitting exercises as well as a walking program even if the walking is in the home.  Pt encouraged to obtain compression for abdominal.           PT Short Term Goals - 02/18/15 1643    PT SHORT TERM GOAL #1   Title Pt to complete HEP   Time 1   Period Weeks   Status Partially Met  given but pt is not consistant   PT SHORT TERM GOAL #2  Title PT to be able to verbalize the signs and sx of cellulitis and the iimportance of getting antibiotics   Time 2   Period Weeks   Status Achieved   PT SHORT TERM GOAL #3   Time 4   Period Weeks   Status Achieved   PT SHORT TERM GOAL #4   Title Pt to be able to verbalize the care of compression bandages   Time 4   Period Weeks   Status Achieved           PT Long Term Goals - 02/18/15 1644    PT LONG TERM GOAL #1   Title Pt volume to be decreased by 40%   Time 8   Period Weeks   Status On-going   PT LONG TERM GOAL #2   Title Pt to state she is able to walk for 10-15 minues    Time 8   Period Weeks   Status On-going   PT LONG TERM GOAL #3   Title Pt to be knowledgable of the maintainace stage of lymphedema and where to obtain compression garments   Time 8   Period Weeks   Status On-going               Plan - 02/18/15 1635    Clinical Impression Statement Pt still has redness from blisters that occured with last weeks wrapping but overall  improving.  Therapist explaimed to pt that normal frequency of manual lymph drainage is 4 to 5 times a week and decreasing to twice a week may result in not having any additional fluid loss which would result in discharge.  Pt verbalized understanding.  Pt had fluid pills that she takes reduced by MD secondary to lab work.  Recommend continued manual decongestive techniques followed by compression bandaging,.   Pt will benefit from skilled therapeutic intervention in order to improve on the following deficits Obesity;Increased edema   Rehab Potential Good   PT Frequency 3x / week   PT Duration 12 weeks  4 additional weeks.     PT Treatment/Interventions Compression bandaging;Patient/family education;Manual techniques   PT Next Visit Plan send MD orders for compression garment as well as reidsleeves. Encourage pt to exercise during commercial breaks as well as walk using a timer.  Begin with 2' progress until pt can walk 10' without stopping.    Consulted and Agree with Plan of Care Patient        Problem List There are no active problems to display for this patient.  Rayetta Humphrey, PT CLT [phone] 02/18/2015, 4:45 PM  [facility] [address] [place], [place], [address] Phone: [phone]   Fax:  [phone]

## 2015-02-19 ENCOUNTER — Telehealth (HOSPITAL_COMMUNITY): Payer: Self-pay | Admitting: Physical Therapy

## 2015-02-19 NOTE — Telephone Encounter (Signed)
Patient requested that I tell Stacey Miller that she wants to keep coming 3x week for Lymphedema treatment.

## 2015-02-20 ENCOUNTER — Encounter (HOSPITAL_COMMUNITY): Payer: Medicare Other | Admitting: Physical Therapy

## 2015-02-22 ENCOUNTER — Ambulatory Visit (HOSPITAL_COMMUNITY): Payer: Medicare Other | Admitting: Physical Therapy

## 2015-02-22 DIAGNOSIS — I890 Lymphedema, not elsewhere classified: Secondary | ICD-10-CM | POA: Diagnosis not present

## 2015-02-22 NOTE — Therapy (Signed)
[place], [place], [address] Phone: [phone]   Fax:  [phone]  Physical Therapy Treatment  Patient Details  Name: CATHEY FREDENBURG MRN: [id number] Date of Birth: [birth year] Referring Provider:  Christain Sacramento, MD  Encounter Date: 02/22/2015      PT End of Session - 02/22/15 1733    Visit Number 13   Number of Visits 24   Date for PT Re-Evaluation 03/20/15   Authorization Type Medicare    Authorization - Visit Number 13   Authorization - Number of Visits 20   PT Start Time 1300   PT Stop Time 1430   PT Time Calculation (min) 90 min   Activity Tolerance Patient tolerated treatment well      No past medical history on file.  No past surgical history on file.  There were no vitals filed for this visit.  Visit Diagnosis:  Lymphedema      Subjective Assessment - 02/22/15 1731    Subjective Pt states every time she comes to therapy she is needing to void more.  Pt states her ankles are the smallest she can remember them being therefore she desires to continue 3x a week                [facility] Adult PT Treatment/Exercise - 02/22/15 0001    Manual Therapy   Manual Therapy Edema management;Manual Lymphatic Drainage (MLD)   Edema Management for bilateral LE's   Manual Lymphatic Drainage (MLD) supraclavicular; deep and superficial abdominal followed by anterior inguinal-axillary anastomsis B. B LE bandaged toes to hip using...Marland KitchenMarland Kitchen. 1/2" foam and multilayer short stretch bandages.            PT Short Term Goals - 02/18/15 1643    PT SHORT TERM GOAL #1   Title Pt to complete HEP   Time 1   Period Weeks   Status Partially Met  given but pt is not consistant   PT SHORT TERM GOAL #2   Title PT to be able to verbalize the signs and sx of cellulitis and the iimportance of getting antibiotics   Time 2   Period Weeks   Status Achieved   PT SHORT TERM GOAL #3   Time 4   Period Weeks   Status  Achieved   PT SHORT TERM GOAL #4   Title Pt to be able to verbalize the care of compression bandages   Time 4   Period Weeks   Status Achieved           PT Long Term Goals - 02/18/15 1644    PT LONG TERM GOAL #1   Title Pt volume to be decreased by 40%   Time 8   Period Weeks   Status On-going   PT LONG TERM GOAL #2   Title Pt to state she is able to walk for 10-15 minues    Time 8   Period Weeks   Status On-going   PT LONG TERM GOAL #3   Title Pt to be knowledgable of the maintainace stage of lymphedema and where to obtain compression garments   Time 8   Period Weeks   Status On-going               Plan - 02/22/15 1733    Clinical Impression Statement Pt redness has decreased along blister area.  Therapist continued to urge pt to get compression for abdominal area but pt does not feel like she  would be able to tolerate it.    PT Next Visit Plan send MD orders for compression garment as well as reidsleeves.   Remeasure volume next week.    Consulted and Agree with Plan of Care Patient        Problem List There are no active problems to display for this patient. Rayetta Humphrey, PT CLT [phone] 02/22/2015, 5:37 PM  [facility] [address] [place], [place], [address] Phone: [phone]   Fax:  [phone]

## 2015-02-25 ENCOUNTER — Ambulatory Visit (HOSPITAL_COMMUNITY): Payer: Medicare Other | Admitting: Physical Therapy

## 2015-02-25 DIAGNOSIS — I890 Lymphedema, not elsewhere classified: Secondary | ICD-10-CM | POA: Diagnosis not present

## 2015-02-25 NOTE — Therapy (Signed)
[place], [place], [address] Phone: [phone]   Fax:  [phone]  Physical Therapy Treatment  Patient Details  Name: Stacey Miller MRN: [id number] Date of Birth: 09/21/1950 Referring Provider:  Christain Sacramento, MD  Encounter Date: 02/25/2015      PT End of Session - 02/25/15 1420    Visit Number 14   Number of Visits 24   Date for PT Re-Evaluation 03/20/15   Authorization Type Medicare    Authorization - Visit Number 14   Authorization - Number of Visits 20   PT Start Time 1310   PT Stop Time 1418   PT Time Calculation (min) 68 min   Activity Tolerance Patient tolerated treatment well   Behavior During Therapy [facility] for tasks assessed/performed      No past medical history on file.  No past surgical history on file.  There were no vitals filed for this visit.  Visit Diagnosis:  Lymphedema      Subjective Assessment - 02/25/15 1418    Subjective Pt states she fell last saturday and her Rt hip is still sore.  Noted with large hematoma Rt hip and lateral thigh.  Currently without pain.   Currently in Pain? No/denies                         [facility] Adult PT Treatment/Exercise - 02/25/15 1419    Manual Therapy   Manual Therapy Edema management;Manual Lymphatic Drainage (MLD)   Edema Management for bilateral LE's   Manual Lymphatic Drainage (MLD) supraclavicular; deep and superficial abdominal followed by anterior inguinal-axillary anastomsis B. B LE bandaged toes to hip using...Marland KitchenMarland Kitchen. 1/2" foam and multilayer short stretch bandages.                  PT Short Term Goals - 02/18/15 1643    PT SHORT TERM GOAL #1   Title Pt to complete HEP   Time 1   Period Weeks   Status Partially Met  given but pt is not consistant   PT SHORT TERM GOAL #2   Title PT to be able to verbalize the signs and sx of cellulitis and the iimportance of getting antibiotics   Time 2   Period Weeks   Status Achieved   PT SHORT TERM GOAL #3   Time 4   Period Weeks   Status Achieved   PT SHORT TERM GOAL #4   Title Pt to be able to verbalize the care of compression bandages   Time 4   Period Weeks   Status Achieved           PT Long Term Goals - 02/18/15 1644    PT LONG TERM GOAL #1   Title Pt volume to be decreased by 40%   Time 8   Period Weeks   Status On-going   PT LONG TERM GOAL #2   Title Pt to state she is able to walk for 10-15 minues    Time 8   Period Weeks   Status On-going   PT LONG TERM GOAL #3   Title Pt to be knowledgable of the maintainace stage of lymphedema and where to obtain compression garments   Time 8   Period Weeks   Status On-going               Plan - 02/25/15 1421    Clinical Impression Statement Pt with noted redness Rt  great toe MTP that she reports thinks is gout flare up.  Discussed getting a shaper rather than an abdominal binder that would encompass thighs and waist.  Pt states she may be able to use something like this (Pt told therapist would bring one in to show her).  Continued Manual lymph drainage with light touch only on Rt lateral hip and trunk due to tenderness from hematoma.  Bilateral LE's bandaged to thigh level.    Order sent to MD for compression garments and reidsleeves.   PT Next Visit Plan Continue CDT.   Remeasure volume end of week.  Fax order for garment/demographic sheet to [facility] when received.    Consulted and Agree with Plan of Care Patient        Problem List There are no active problems to display for this patient.   Teena Irani, PTA/CLT [phone]  02/25/2015, 2:28 PM  [facility] [address] [place], [place], [address] Phone: [phone]   Fax:  [phone]

## 2015-02-27 ENCOUNTER — Ambulatory Visit (HOSPITAL_COMMUNITY): Payer: Medicare Other | Admitting: Physical Therapy

## 2015-02-27 DIAGNOSIS — I890 Lymphedema, not elsewhere classified: Secondary | ICD-10-CM

## 2015-02-27 NOTE — Therapy (Signed)
[place], [place], [address] Phone: [phone]   Fax:  [phone]  Physical Therapy Treatment  Patient Details  Name: Stacey Miller MRN: [id number] Date of Birth: March 31, 1951 Referring Provider:  Christain Sacramento, MD  Encounter Date: 02/27/2015      PT End of Session - 02/27/15 1430    Visit Number 15   Number of Visits 24   Authorization Type Medicare    Authorization - Visit Number 15   Authorization - Number of Visits 20   PT Start Time 4650   PT Stop Time 1428   PT Time Calculation (min) 82 min      No past medical history on file.  No past surgical history on file.  There were no vitals filed for this visit.  Visit Diagnosis:  Lymphedema      Subjective Assessment - 02/27/15 1429    Subjective Pt states that after the manual she needs to urinate quite a bit more.    Currently in Pain? No/denies            [facility] Adult PT Treatment/Exercise - 02/27/15 0001    Manual Therapy   Manual Therapy Edema management;Manual Lymphatic Drainage (MLD)   Edema Management for bilateral LE's   Manual Lymphatic Drainage (MLD) supraclavicular; deep and superficial abdominal followed by anterior inguinal-axillary anastomsis B. B LE bandaged toes to hip using...Marland KitchenMarland Kitchen. 1/2" foam and multilayer short stretch bandages.               PT Short Term Goals - 02/18/15 1643    PT SHORT TERM GOAL #1   Title Pt to complete HEP   Time 1   Period Weeks   Status Partially Met  given but pt is not consistant   PT SHORT TERM GOAL #2   Title PT to be able to verbalize the signs and sx of cellulitis and the iimportance of getting antibiotics   Time 2   Period Weeks   Status Achieved   PT SHORT TERM GOAL #3   Time 4   Period Weeks   Status Achieved   PT SHORT TERM GOAL #4   Title Pt to be able to verbalize the care of compression bandages   Time 4   Period Weeks   Status Achieved           PT Long Term  Goals - 02/18/15 1644    PT LONG TERM GOAL #1   Title Pt volume to be decreased by 40%   Time 8   Period Weeks   Status On-going   PT LONG TERM GOAL #2   Title Pt to state she is able to walk for 10-15 minues    Time 8   Period Weeks   Status On-going   PT LONG TERM GOAL #3   Title Pt to be knowledgable of the maintainace stage of lymphedema and where to obtain compression garments   Time 8   Period Weeks   Status On-going               Plan - 02/27/15 1431    Clinical Impression Statement Pt rednes on great toe has decreased.  Pt states therapist noted increased induration on medial aspect of Rt thigh last ssession which has decreased as well.  Pt encuraged to attempt to get compression for abdominal area using spandex t-shirt from [employer] or [employer].    PT Next Visit Plan Remeasure next visit  for volume        Problem List There are no active problems to display for this patient.  Rayetta Humphrey, PT CLT [phone] 02/27/2015, 2:34 PM  [facility] [address] [place], [place], [address] Phone: [phone]   Fax:  [phone]

## 2015-03-06 ENCOUNTER — Ambulatory Visit (HOSPITAL_COMMUNITY): Payer: Medicare Other | Admitting: Physical Therapy

## 2015-03-06 DIAGNOSIS — I890 Lymphedema, not elsewhere classified: Secondary | ICD-10-CM

## 2015-03-06 NOTE — Therapy (Signed)
[place], [place], [address] Phone: [phone]   Fax:  [phone]  Physical Therapy Treatment  Patient Details  Name: Stacey Miller MRN: [id number] Date of Birth: 05/11/1951 Referring Provider:  Christain Sacramento, MD  Encounter Date: 03/06/2015      PT End of Session - 03/06/15 1746    Visit Number 16   Number of Visits 24   Date for PT Re-Evaluation 03/20/15   Authorization Type Medicare    Authorization - Visit Number 16   Authorization - Number of Visits 20   PT Start Time 1430   PT Stop Time 1600   PT Time Calculation (min) 90 min   Activity Tolerance Patient tolerated treatment well   Behavior During Therapy [facility] for tasks assessed/performed      No past medical history on file.  No past surgical history on file.  There were no vitals filed for this visit.  Visit Diagnosis:  Lymphedema      Subjective Assessment - 03/06/15 1743    Subjective Pt states she can tell her legs are getting much smaller and therapy is working.    Currently in Pain? No/denies                         [facility] Adult PT Treatment/Exercise - 03/06/15 1745    Manual Therapy   Manual Therapy Edema management;Manual Lymphatic Drainage (MLD)   Edema Management for bilateral LE's   Manual Lymphatic Drainage (MLD) supraclavicular; deep and superficial abdominal followed by anterior inguinal-axillary anastomsis B. B LE bandaged toes to hip using...Marland KitchenMarland Kitchen. 1/2" foam and multilayer short stretch bandages.                  PT Short Term Goals - 02/18/15 1643    PT SHORT TERM GOAL #1   Title Pt to complete HEP   Time 1   Period Weeks   Status Partially Met  given but pt is not consistant   PT SHORT TERM GOAL #2   Title PT to be able to verbalize the signs and sx of cellulitis and the iimportance of getting antibiotics   Time 2   Period Weeks   Status Achieved   PT SHORT TERM GOAL #3   Time 4   Period  Weeks   Status Achieved   PT SHORT TERM GOAL #4   Title Pt to be able to verbalize the care of compression bandages   Time 4   Period Weeks   Status Achieved           PT Long Term Goals - 02/18/15 1644    PT LONG TERM GOAL #1   Title Pt volume to be decreased by 40%   Time 8   Period Weeks   Status On-going   PT LONG TERM GOAL #2   Title Pt to state she is able to walk for 10-15 minues    Time 8   Period Weeks   Status On-going   PT LONG TERM GOAL #3   Title Pt to be knowledgable of the maintainace stage of lymphedema and where to obtain compression garments   Time 8   Period Weeks   Status On-going               Plan - 03/06/15 1750    Clinical Impression Statement Pt wtihout redness anywhere bilateral LE's.  Manual completed followed by bandaging of biltateral LE's  with short stretch and 1/2 inch foam.  Pt shown spanx undergarment that is biker short and goes all the way up to waist area.   Encouraged patient to purchase one to help with abdomimal edema.   PT Next Visit Plan Remeasure next visit for volume        Problem List There are no active problems to display for this patient.   Teena Irani, PTA/CLT [phone]  03/06/2015, 5:54 PM  [facility] [address] [place], [place], [address] Phone: [phone]   Fax:  [phone]

## 2015-03-08 ENCOUNTER — Ambulatory Visit (HOSPITAL_COMMUNITY): Payer: Medicare Other | Attending: Family Medicine | Admitting: Physical Therapy

## 2015-03-08 DIAGNOSIS — I890 Lymphedema, not elsewhere classified: Secondary | ICD-10-CM | POA: Insufficient documentation

## 2015-03-08 NOTE — Therapy (Signed)
[place], [place], [address] Phone: [phone]   Fax:  [phone]  Physical Therapy Treatment  Patient Details  Name: Stacey Miller MRN: [id number] Date of Birth: [birth year] Referring Provider:  Christain Sacramento, MD  Encounter Date: 03/08/2015      PT End of Session - 03/08/15 1630    Visit Number 17   Number of Visits 24   Date for PT Re-Evaluation 03/20/15   Authorization Type Medicare    Authorization - Visit Number 17   Authorization - Number of Visits 20   PT Start Time 0929   PT Stop Time 1520   PT Time Calculation (min) 88 min   Activity Tolerance Patient tolerated treatment well      No past medical history on file.  No past surgical history on file.  There were no vitals filed for this visit.  Visit Diagnosis:  Lymphedema      Subjective Assessment - 03/08/15 1630    Subjective I'm able to get my legs up onto the table by myself now.    Currently in Pain? No/denies          Date 12/27/2014 12/27/2014 02/18/2015 02/18/2015 03/08/2015 7/1/20016   RT LT Rt LT RT  LT   MTP 23.00 23.00 23 24 23.2 23.2  ankle 25.4 26.2 29.50 31.00 27.10 30.40  4cm 26.60 29.00 27.40 29.70 26.70 28.70  8cm 32.80 33.90 27.50 31.80 30.00 30.50  12 cm 38.00 40.30 32.90 39.00 34.00 34.80  16cm 44.00 45.80 40.00 44.90 41.40 40.20  20cm 47.00 50.10 43.50 50.10 44.00 46.50  24cm 46.70 54.00 45.70 52.70 48.10 51.40  28cm 59.50 59.50 49.50 53.30 49.10 55.30  32cm 63.20 65.40 49.20 58.10 55.00 58.30  36cm 81.60 69.70 57.00 66.00 59.20 62.40  40cm 87.50 79.00 61.00 68.20 60.90 68.00   83.40 82.00 81.00 77.50 76.80 76.50                                                  Sum of squares   27971.30 33484.63 28552.61 31674.62  Total Volume   5747.3403 10658.493 9088.581 10082.28                    [facility] Adult PT Treatment/Exercise - 03/08/15 0001    Manual Therapy   Manual Therapy Edema management;Manual  Lymphatic Drainage (MLD)   Edema Management for bilateral LE's   Manual Lymphatic Drainage (MLD) supraclavicular; deep and superficial abdominal followed by anterior inguinal-axillary anastomsis B. B LE bandaged toes to hip using...Marland KitchenMarland Kitchen. 1/2" foam and multilayer short stretch bandages.                  PT Short Term Goals - 02/18/15 1643    PT SHORT TERM GOAL #1   Title Pt to complete HEP   Time 1   Period Weeks   Status Partially Met  given but pt is not consistant   PT SHORT TERM GOAL #2   Title PT to be able to verbalize the signs and sx of cellulitis and the iimportance of getting antibiotics   Time 2   Period Weeks   Status Achieved   PT SHORT TERM GOAL #3   Time 4   Period Weeks   Status Achieved   PT SHORT TERM GOAL #4  Title Pt to be able to verbalize the care of compression bandages   Time 4   Period Weeks   Status Achieved           PT Long Term Goals - 02/18/15 1644    PT LONG TERM GOAL #1   Title Pt volume to be decreased by 40%   Time 8   Period Weeks   Status On-going   PT LONG TERM GOAL #2   Title Pt to state she is able to walk for 10-15 minues    Time 8   Period Weeks   Status On-going   PT LONG TERM GOAL #3   Title Pt to be knowledgable of the maintainace stage of lymphedema and where to obtain compression garments   Time 8   Period Weeks   Status On-going               Plan - 03/08/15 1631    Clinical Impression Statement Pt has not purchased any item to compress abdominal area.  Pt has lost 566 cc of fluid from her Lt LE but has gained 185 in her Rt LE .  Pt is able to ambulate with greater ease.     PT Next Visit Plan continue manual and compression wrapping with weekly fluid measurements.         Problem List There are no active problems to display for this patient.   Rayetta Humphrey, PT CLT [phone] 03/08/2015, 4:34 PM  [facility] [address] [place], [place],  [address] Phone: [phone]   Fax:  [phone]

## 2015-03-13 ENCOUNTER — Ambulatory Visit (HOSPITAL_COMMUNITY): Payer: Medicare Other | Admitting: Physical Therapy

## 2015-03-13 DIAGNOSIS — I890 Lymphedema, not elsewhere classified: Secondary | ICD-10-CM

## 2015-03-13 NOTE — Therapy (Signed)
[place], [place], [address] Phone: [phone]   Fax:  [phone]  Physical Therapy Treatment  Patient Details  Name: Stacey Miller MRN: [id number] Date of Birth: 1951-04-12 Referring Provider:  Christain Sacramento, MD  Encounter Date: 03/13/2015      PT End of Session - 03/13/15 1423    Visit Number 17   Number of Visits 24   Date for PT Re-Evaluation 03/20/15   Authorization Type Medicare    Authorization - Visit Number 17   Authorization - Number of Visits 20   PT Start Time 4503   PT Stop Time 1425   PT Time Calculation (min) 80 min   Activity Tolerance Patient tolerated treatment well   Behavior During Therapy [facility] for tasks assessed/performed      No past medical history on file.  No past surgical history on file.  There were no vitals filed for this visit.  Visit Diagnosis:  Lymphedema      Subjective Assessment - 03/13/15 1423    Subjective Pt states she is doing well and it is easier to get in and out of bed.   Currently in Pain? No/denies                         [facility] Adult PT Treatment/Exercise - 03/13/15 1423    Manual Therapy   Manual Therapy Edema management;Manual Lymphatic Drainage (MLD)   Edema Management for bilateral LE's   Manual Lymphatic Drainage (MLD) supraclavicular; deep and superficial abdominal followed by anterior inguinal-axillary anastomsis B. B LE bandaged toes to hip using...Marland KitchenMarland Kitchen. 1/2" foam and multilayer short stretch bandages.                  PT Short Term Goals - 02/18/15 1643    PT SHORT TERM GOAL #1   Title Pt to complete HEP   Time 1   Period Weeks   Status Partially Met  given but pt is not consistant   PT SHORT TERM GOAL #2   Title PT to be able to verbalize the signs and sx of cellulitis and the iimportance of getting antibiotics   Time 2   Period Weeks   Status Achieved   PT SHORT TERM GOAL #3   Time 4   Period Weeks   Status Achieved   PT SHORT TERM GOAL #4   Title Pt to be able to verbalize the care of compression bandages   Time 4   Period Weeks   Status Achieved           PT Long Term Goals - 02/18/15 1644    PT LONG TERM GOAL #1   Title Pt volume to be decreased by 40%   Time 8   Period Weeks   Status On-going   PT LONG TERM GOAL #2   Title Pt to state she is able to walk for 10-15 minues    Time 8   Period Weeks   Status On-going   PT LONG TERM GOAL #3   Title Pt to be knowledgable of the maintainace stage of lymphedema and where to obtain compression garments   Time 8   Period Weeks   Status On-going               Plan - 03/13/15 1424    Clinical Impression Statement Manual lymph drainage completed with bandaging to bilateral LE's using short stretch, 1/2 inch foam  and cotton padding.  Pt reported comfort at end of session.     PT Next Visit Plan Re-evaluate X 3 more sessions.          Problem List There are no active problems to display for this patient.   Teena Irani, PTA/CLT [phone]  03/13/2015, 2:26 PM  [place] [address] [place], [place], [address] Phone: [phone]   Fax:  [phone]

## 2015-03-18 ENCOUNTER — Ambulatory Visit (HOSPITAL_COMMUNITY): Payer: Medicare Other | Admitting: Physical Therapy

## 2015-03-18 DIAGNOSIS — I890 Lymphedema, not elsewhere classified: Secondary | ICD-10-CM | POA: Diagnosis not present

## 2015-03-18 NOTE — Therapy (Signed)
[place], [place], [address] Phone: [phone]   Fax:  [phone]  Physical Therapy Treatment  Patient Details  Name: Stacey Miller MRN: [id number] Date of Birth: [birth year] Referring Elroy Schembri:  Christain Sacramento, MD  Encounter Date: 03/18/2015      PT End of Session - 03/18/15 1644    Visit Number 18   Number of Visits 24   Date for PT Re-Evaluation 03/20/15   Authorization Type Medicare    Authorization - Visit Number 18   Authorization - Number of Visits 20   PT Start Time 9702   PT Stop Time 1415   PT Time Calculation (min) 71 min   Activity Tolerance Patient tolerated treatment well      No past medical history on file.  No past surgical history on file.  There were no vitals filed for this visit.  Visit Diagnosis:  Lymphedema      Subjective Assessment - 03/18/15 1641    Subjective Pt  continues to complain about her abdominal swelling.  Therapist stated that we have been telling her for weeks to obtain compression via spandex ie assest or tank tops but pt has not done so.  Pt states she will try to obtain abdominal compression by next session.  .  Pt states she is walking for five minutes twice a day.  Therapist urged pt to increase her ambulation to ten minutes.    Pertinent History Pt is in renal falure and is on six different diuretic;; pt wasl hospitalized 5 times last year for various complications of her lymphedema. Pt has pulmonary hypertension and is on oxygen; Pt fx Lt ankle 02/2014 decreased ROM ever since                 [facility] Adult PT Treatment/Exercise - 03/18/15 0001    Manual Therapy   Manual Therapy Edema management;Manual Lymphatic Drainage (MLD)   Edema Management for bilateral LE's   Manual Lymphatic Drainage (MLD) supraclavicular; deep and superficial abdominal followed by anterior inguinal-axillary anastomsis B. B LE bandaged toes to hip using...Marland KitchenMarland Kitchen. 1/2" foam and  multilayer short stretch bandages.               PT Short Term Goals - 02/18/15 1643    PT SHORT TERM GOAL #1   Title Pt to complete HEP   Time 1   Period Weeks   Status Partially Met  given but pt is not consistant   PT SHORT TERM GOAL #2   Title PT to be able to verbalize the signs and sx of cellulitis and the iimportance of getting antibiotics   Time 2   Period Weeks   Status Achieved   PT SHORT TERM GOAL #3   Time 4   Period Weeks   Status Achieved   PT SHORT TERM GOAL #4   Title Pt to be able to verbalize the care of compression bandages   Time 4   Period Weeks   Status Achieved           PT Long Term Goals - 02/18/15 1644    PT LONG TERM GOAL #1   Title Pt volume to be decreased by 40%   Time 8   Period Weeks   Status On-going   PT LONG TERM GOAL #2   Title Pt to state she is able to walk for 10-15 minues    Time 8   Period Weeks   Status  On-going   PT LONG TERM GOAL #3   Title Pt to be knowledgable of the maintainace stage of lymphedema and where to obtain compression garments   Time 8   Period Weeks   Status On-going               Plan - 03/18/15 1645    Clinical Impression Statement Pt appears to be continuing to decongest.  Pt information has been recieved by copression garment fitter who will be ready to fit pt when therapy states she is ready.    PT Treatment/Interventions Compression bandaging;Patient/family education;Manual techniques   PT Next Visit Plan Remeasure pt for volume next session.         Problem List There are no active problems to display for this patient.   Rayetta Humphrey, PT CLT [phone] 03/18/2015, 4:49 PM  [place] [address] [place], [place], [address] Phone: [phone]   Fax:  [phone]

## 2015-03-20 ENCOUNTER — Ambulatory Visit (HOSPITAL_COMMUNITY): Payer: Medicare Other | Admitting: Physical Therapy

## 2015-03-20 DIAGNOSIS — I890 Lymphedema, not elsewhere classified: Secondary | ICD-10-CM | POA: Diagnosis not present

## 2015-03-20 NOTE — Therapy (Signed)
[place], [place], [address] Phone: [phone]   Fax:  [phone]  Physical Therapy Treatment  Patient Details  Name: Stacey Miller MRN: [id number] Date of Birth: December 28, 1950 Referring Provider:  Christain Sacramento, MD  Encounter Date: 03/20/2015      PT End of Session - 03/20/15 1435    Visit Number 19   Number of Visits 24   Date for PT Re-Evaluation 03/20/15   Authorization Type Medicare    Authorization - Visit Number 95   Authorization - Number of Visits 20   PT Start Time 1300   PT Stop Time 1425   PT Time Calculation (min) 85 min   Activity Tolerance Patient tolerated treatment well   Behavior During Therapy [facility] for tasks assessed/performed      No past medical history on file.  No past surgical history on file.  There were no vitals filed for this visit.  Visit Diagnosis:  Lymphedema      Subjective Assessment - 03/20/15 1451    Subjective Pt states she plans to go obtain abdomional compression this week.  States she can tell an improvement in her LE swelling.    Currently in Pain? No/denies                         [facility] Adult PT Treatment/Exercise - 03/20/15 1435    Manual Therapy   Manual Therapy Edema management;Manual Lymphatic Drainage (MLD)   Edema Management for bilateral LE's   Manual Lymphatic Drainage (MLD) supraclavicular; deep and superficial abdominal followed by anterior inguinal-axillary anastomsis B. B LE bandaged toes to hip using...Marland KitchenMarland Kitchen. 1/2" foam and multilayer short stretch bandages.                  PT Short Term Goals - 02/18/15 1643    PT SHORT TERM GOAL #1   Title Pt to complete HEP   Time 1   Period Weeks   Status Partially Met  given but pt is not consistant   PT SHORT TERM GOAL #2   Title PT to be able to verbalize the signs and sx of cellulitis and the iimportance of getting antibiotics   Time 2   Period Weeks   Status Achieved   PT SHORT TERM GOAL #3   Time 4   Period Weeks   Status Achieved   PT SHORT TERM GOAL #4   Title Pt to be able to verbalize the care of compression bandages   Time 4   Period Weeks   Status Achieved           PT Long Term Goals - 02/18/15 1644    PT LONG TERM GOAL #1   Title Pt volume to be decreased by 40%   Time 8   Period Weeks   Status On-going   PT LONG TERM GOAL #2   Title Pt to state she is able to walk for 10-15 minues    Time 8   Period Weeks   Status On-going   PT LONG TERM GOAL #3   Title Pt to be knowledgable of the maintainace stage of lymphedema and where to obtain compression garments   Time 8   Period Weeks   Status On-going               Plan - 03/20/15 1437    Clinical Impression Statement PT continues to respond to CLT favorably.  Unsure  of amount of decompression since last session but is due for re-evaluation next visit.  Pt request bandages not go all way to groin due to diffiuculty urinating and getting bandages wet.   No cotton padding was available today for bandaging.    PT Treatment/Interventions Compression bandaging;Patient/family education;Manual techniques   PT Next Visit Plan Remeasure and re-evaluate next session.        Problem List There are no active problems to display for this patient.   Teena Irani, PTA/CLT [phone]  03/20/2015, 2:53 PM  [place] [address] [place], [place], [address] Phone: [phone]   Fax:  [phone]

## 2015-03-22 ENCOUNTER — Ambulatory Visit (HOSPITAL_COMMUNITY): Payer: Medicare Other | Admitting: Physical Therapy

## 2015-03-22 DIAGNOSIS — I890 Lymphedema, not elsewhere classified: Secondary | ICD-10-CM | POA: Diagnosis not present

## 2015-03-22 NOTE — Therapy (Signed)
[place], [place], [address] Phone: [phone]   Fax:  [phone]  Physical Therapy Treatment  Patient Details  Name: Stacey Miller MRN: [id number] Date of Birth: 02-16-1951 Referring Provider:  Christain Sacramento, MD  Encounter Date: 03/22/2015      PT End of Session - 03/22/15 1612    Visit Number 20   Number of Visits 24   Date for PT Re-Evaluation 03/20/15   Authorization Type Medicare    Authorization - Visit Number 20   Authorization - Number of Visits 26   PT Start Time 1300   PT Stop Time 1430   PT Time Calculation (min) 90 min      No past medical history on file.  No past surgical history on file.  There were no vitals filed for this visit.  Visit Diagnosis:  Lymphedema      Subjective Assessment - 03/22/15 1611    Subjective Pt states she has not obtained any abdominal compression yet.  Not sleeping well          Date 12/27/2014 12/27/2014 02/18/2015 02/18/2015 03/08/2015 7/1/20016 7/15/ 7/1/20016   RT LT Rt LT RT  LT  RT  LT   MTP 23.00 23.00 23 24 23.2 23.2 23.6 24.3  ankle 25.4 26.2 29.50 31.00 27.10 30.40 29.00 32.40  4cm 26.60 29.00 27.40 29.70 26.70 28.70 27.20 28.90  8cm 32.80 33.90 27.50 31.80 30.00 30.50 28.90 29.80  12 cm 38.00 40.30 32.90 39.00 34.00 34.80 35.50 35.30  16cm 44.00 45.80 40.00 44.90 41.40 40.20 39.80 41.70  20cm 47.00 50.10 43.50 50.10 44.00 46.50 45.20 49.10  24cm 46.70 54.00 45.70 52.70 48.10 51.40 48.00 52.30  28cm 59.50 59.50 49.50 53.30 49.10 55.30 48.50 52.90  32cm 63.20 65.40 49.20 58.10 55.00 58.30 55.70 57.60  36cm 81.60 69.70 57.00 66.00 59.20 62.40 60.20 63.70  40cm 87.50 79.00 61.00 68.20 60.90 68.00 73.00 65.70   83.40 82.00 81.00 77.50 76.80 76.50 81.30 77.20                                                              Sum of squares   27971.30 33484.63 28552.61 31674.62 31181.81 31944.77  Total Volume   8903.5445 10658.493 9088.581 10082.35  9925.482 10168.34                    [facility] Adult PT Treatment/Exercise - 03/22/15 0001    Manual Therapy   Manual Therapy Edema management;Manual Lymphatic Drainage (MLD)   Edema Management for bilateral LE's   Manual Lymphatic Drainage (MLD) supraclavicular; deep and superficial abdominal followed by anterior inguinal-axillary anastomsis B. B LE bandaged toes to hip using...Marland KitchenMarland Kitchen. 1/2" foam and multilayer short stretch bandages.                  PT Short Term Goals - 02/18/15 1643    PT SHORT TERM GOAL #1   Title Pt to complete HEP   Time 1   Period Weeks   Status Partially Met  given but pt is not consistant   PT SHORT TERM GOAL #2   Title PT to be able to verbalize the signs and sx of cellulitis and the iimportance of getting antibiotics   Time 2  Period Weeks   Status Achieved   PT SHORT TERM GOAL #3   Time 4   Period Weeks   Status Achieved   PT SHORT TERM GOAL #4   Title Pt to be able to verbalize the care of compression bandages   Time 4   Period Weeks   Status Achieved           PT Long Term Goals - 02/18/15 1644    PT LONG TERM GOAL #1   Title Pt volume to be decreased by 40%   Time 8   Period Weeks   Status On-going   PT LONG TERM GOAL #2   Title Pt to state she is able to walk for 10-15 minues    Time 8   Period Weeks   Status On-going   PT LONG TERM GOAL #3   Title Pt to be knowledgable of the maintainace stage of lymphedema and where to obtain compression garments   Time 8   Period Weeks   Status On-going               Plan - 20-Apr-2015 1612    Clinical Impression Statement Pt has gone up in fluid B LE since last measurment but temperatures have been very hot and humid which may be a factor.  Pt will be seen for two more weeks and remeasured if no improvement will have pt measured for garments for maintenance phase of treament.    PT Next Visit Plan see for two more weeks then possible decrease to twice a week while pt  awaits compression garments           G-Codes - April 20, 2015 1615    Functional Limitation Other PT primary   Other PT Primary Current Status (K2575) At least 60 percent but less than 80 percent impaired, limited or restricted   Other PT Primary Goal Status (Y5183) At least 40 percent but less than 60 percent impaired, limited or restricted      Problem List There are no active problems to display for this patient. Rayetta Humphrey, PT CLT [phone] 04-20-2015, 4:17 PM  [place] [address] [place], [place], [address] Phone: [phone]   Fax:  [phone]

## 2015-03-25 ENCOUNTER — Encounter (HOSPITAL_COMMUNITY): Payer: Medicare Other | Admitting: Physical Therapy

## 2015-03-26 ENCOUNTER — Ambulatory Visit (HOSPITAL_COMMUNITY): Payer: Medicare Other | Admitting: Physical Therapy

## 2015-03-26 DIAGNOSIS — I890 Lymphedema, not elsewhere classified: Secondary | ICD-10-CM

## 2015-03-26 NOTE — Therapy (Signed)
[place], [place], [address] Phone: [phone]   Fax:  [phone]  Physical Therapy Treatment  Patient Details  Name: Stacey Miller MRN: [id number] Date of Birth: Jul 21, 1951 Referring Provider:  Christain Sacramento, MD  Encounter Date: 03/26/2015      PT End of Session - 03/26/15 1059    Visit Number 21   Number of Visits 24   Authorization Type Medicare    Authorization - Visit Number 21   Authorization - Number of Visits 24   PT Start Time 0932   PT Stop Time 1055   PT Time Calculation (min) 83 min      No past medical history on file.  No past surgical history on file.  There were no vitals filed for this visit.  Visit Diagnosis:  Lymphedema      Subjective Assessment - 03/26/15 1055    Subjective Pt still has not obtained abdominal compression.  States she feels she is not losing volume as she was.                           [facility] Adult PT Treatment/Exercise - 03/26/15 1056    Manual Therapy   Manual Therapy Edema management;Manual Lymphatic Drainage (MLD)   Edema Management for bilateral LE's   Manual Lymphatic Drainage (MLD) supraclavicular; deep and superficial abdominal followed by anterior inguinal-axillary anastomsis B. B LE bandaged toes to hip using...Marland KitchenMarland Kitchen. 1/2" foam and multilayer short stretch bandages.                  PT Short Term Goals - 02/18/15 1643    PT SHORT TERM GOAL #1   Title Pt to complete HEP   Time 1   Period Weeks   Status Partially Met  given but pt is not consistant   PT SHORT TERM GOAL #2   Title PT to be able to verbalize the signs and sx of cellulitis and the iimportance of getting antibiotics   Time 2   Period Weeks   Status Achieved   PT SHORT TERM GOAL #3   Time 4   Period Weeks   Status Achieved   PT SHORT TERM GOAL #4   Title Pt to be able to verbalize the care of compression bandages   Time 4   Period Weeks   Status  Achieved           PT Long Term Goals - 02/18/15 1644    PT LONG TERM GOAL #1   Title Pt volume to be decreased by 40%   Time 8   Period Weeks   Status On-going   PT LONG TERM GOAL #2   Title Pt to state she is able to walk for 10-15 minues    Time 8   Period Weeks   Status On-going   PT LONG TERM GOAL #3   Title Pt to be knowledgable of the maintainace stage of lymphedema and where to obtain compression garments   Time 8   Period Weeks   Status On-going               Plan - 03/26/15 1057    Clinical Impression Statement Increased time spent with manual in abdominal area.  Therapist stressed the importance of getting abdominal compression of some sort.    PT Next Visit Plan re-measure next week to assess if pt should go into maintainance mode.  Problem List There are no active problems to display for this patient.  Rayetta Humphrey, PT CLT [phone] 03/26/2015, 11:02 AM  [place], [place], [address] Phone: [phone]   Fax:  [phone]

## 2015-03-27 ENCOUNTER — Encounter (HOSPITAL_COMMUNITY): Payer: Medicare Other | Admitting: Physical Therapy

## 2015-03-28 ENCOUNTER — Ambulatory Visit (HOSPITAL_COMMUNITY): Payer: Medicare Other | Admitting: Physical Therapy

## 2015-03-28 DIAGNOSIS — I890 Lymphedema, not elsewhere classified: Secondary | ICD-10-CM

## 2015-03-28 NOTE — Therapy (Signed)
[place], [place], [address] Phone: [phone]   Fax:  [phone]  Physical Therapy Treatment  Patient Details  Name: Stacey Miller MRN: [id number] Date of Birth: 1950-12-22 Referring Provider:  Christain Sacramento, MD  Encounter Date: 03/28/2015      PT End of Session - 03/28/15 1108    Visit Number 22   Number of Visits 23   Date for PT Re-Evaluation 03/20/15   Authorization - Visit Number 36   Authorization - Number of Visits 23   PT Start Time 0940   PT Stop Time 1106   PT Time Calculation (min) 86 min   Activity Tolerance Patient tolerated treatment well      No past medical history on file.  No past surgical history on file.  There were no vitals filed for this visit.  Visit Diagnosis:  Lymphedema      Subjective Assessment - 03/28/15 1107    Subjective Pt states she tried to get a lumbar corset but the drugstore stated that she nedded a prescription.  Therapist measured waist and told pt to call MD if needed but she should be able to acquire an abdominal binder without a prescription.    Currently in Pain? No/denies          [facility] Adult PT Treatment/Exercise - 03/28/15 0001    Manual Therapy   Manual Therapy Edema management;Manual Lymphatic Drainage (MLD)   Edema Management for bilateral LE's   Manual Lymphatic Drainage (MLD) supraclavicular; deep and superficial abdominal followed by anterior inguinal-axillary anastomsis B. B LE bandaged toes to hip using...Marland KitchenMarland Kitchen. 1/2" foam and multilayer short stretch bandages.             PT Education - 03/28/15 1107    Education provided Yes   Education Details Urged pt to make a food chart of everything she is eating to determine calorie consumption.  Pt also encouraged to increase walking which is currently at 5 minutes three times a day to 10 minutes three times a day.    Person(s) Educated Patient   Methods Explanation   Comprehension Verbalized  understanding          PT Short Term Goals - 02/18/15 1643    PT SHORT TERM GOAL #1   Title Pt to complete HEP   Time 1   Period Weeks   Status Partially Met  given but pt is not consistant   PT SHORT TERM GOAL #2   Title PT to be able to verbalize the signs and sx of cellulitis and the iimportance of getting antibiotics   Time 2   Period Weeks   Status Achieved   PT SHORT TERM GOAL #3   Time 4   Period Weeks   Status Achieved   PT SHORT TERM GOAL #4   Title Pt to be able to verbalize the care of compression bandages   Time 4   Period Weeks   Status Achieved           PT Long Term Goals - 02/18/15 1644    PT LONG TERM GOAL #1   Title Pt volume to be decreased by 40%   Time 8   Period Weeks   Status On-going   PT LONG TERM GOAL #2   Title Pt to state she is able to walk for 10-15 minues    Time 8   Period Weeks   Status On-going   PT LONG TERM  GOAL #3   Title Pt to be knowledgable of the maintainace stage of lymphedema and where to obtain compression garments   Time 8   Period Weeks   Status On-going               Plan - 03/28/15 1110    Clinical Impression Statement Pt continues to have increase induration in abdominal area.  Pt aware of the need to obtain abdominal compression .   PT Next Visit Plan Re-measure next visit to determine if pt is still losing fluid volume.         Problem List There are no active problems to display for this patient.  Rayetta Humphrey, PT CLT [phone] 03/28/2015, 11:14 AM  [place], [place], [address] Phone: [phone]   Fax:  [phone]

## 2015-04-01 ENCOUNTER — Ambulatory Visit (HOSPITAL_COMMUNITY): Payer: Medicare Other | Admitting: Physical Therapy

## 2015-04-03 ENCOUNTER — Ambulatory Visit (HOSPITAL_COMMUNITY): Payer: Medicare Other | Admitting: Physical Therapy

## 2015-04-03 DIAGNOSIS — I890 Lymphedema, not elsewhere classified: Secondary | ICD-10-CM

## 2015-04-03 NOTE — Therapy (Signed)
[place], [place], [address] Phone: [phone]   Fax:  [phone]  Physical Therapy Treatment  Patient Details  Name: Stacey Miller MRN: [id number] Date of Birth: 18-Apr-1951 Referring Provider:  Christain Sacramento, MD  Encounter Date: 04/03/2015      PT End of Session - 04/03/15 1152    Visit Number 23   Number of Visits 24   Date for PT Re-Evaluation 04/05/15   Authorization Type Medicare (gcodes on visit #20)   Authorization - Visit Number 23   Authorization - Number of Visits 30   PT Start Time 3007   PT Stop Time 1140   PT Time Calculation (min) 85 min   Activity Tolerance Patient tolerated treatment well   Behavior During Therapy [facility] for tasks assessed/performed      No past medical history on file.  No past surgical history on file.  There were no vitals filed for this visit.  Visit Diagnosis:  Lymphedema      Subjective Assessment - 04/03/15 1151    Subjective Pt states she got an abdominal binder and has difficutly with it riding up.   Currently in Pain? No/denies                         [facility] Adult PT Treatment/Exercise - 04/03/15 1152    Manual Therapy   Manual Therapy Edema management;Manual Lymphatic Drainage (MLD)   Edema Management for bilateral LE's   Manual Lymphatic Drainage (MLD) supraclavicular; deep and superficial abdominal followed by anterior inguinal-axillary anastomsis B. B LE bandaged toes to hip using...Marland KitchenMarland Kitchen. 1/2" foam and multilayer short stretch bandages.                  PT Short Term Goals - 02/18/15 1643    PT SHORT TERM GOAL #1   Title Pt to complete HEP   Time 1   Period Weeks   Status Partially Met  given but pt is not consistant   PT SHORT TERM GOAL #2   Title PT to be able to verbalize the signs and sx of cellulitis and the iimportance of getting antibiotics   Time 2   Period Weeks   Status Achieved   PT SHORT TERM GOAL #3   Time 4   Period Weeks   Status Achieved   PT SHORT TERM GOAL #4   Title Pt to be able to verbalize the care of compression bandages   Time 4   Period Weeks   Status Achieved           PT Long Term Goals - 02/18/15 1644    PT LONG TERM GOAL #1   Title Pt volume to be decreased by 40%   Time 8   Period Weeks   Status On-going   PT LONG TERM GOAL #2   Title Pt to state she is able to walk for 10-15 minues    Time 8   Period Weeks   Status On-going   PT LONG TERM GOAL #3   Title Pt to be knowledgable of the maintainace stage of lymphedema and where to obtain compression garments   Time 8   Period Weeks   Status On-going               Plan - 04/03/15 1153    Clinical Impression Statement Pt with very little induration in LE's at this point and suspect mostly adipose tissue  remains.  Pt is trying to loose weight to help with this.  discussed contacting garment fitter as may take a couple weeks to obtain an appointment.  Pt will need full re-eval and determination of continueing services next visit.    PT Next Visit Plan Re-measure and re-evaluate next session.        Problem List There are no active problems to display for this patient.   Teena Irani, PTA/CLT [phone] 04/03/2015, 12:02 PM  [facility] [address] [place], [place], [address] Phone: [phone]   Fax:  [phone]

## 2015-04-05 ENCOUNTER — Ambulatory Visit (HOSPITAL_COMMUNITY): Payer: Medicare Other | Admitting: Physical Therapy

## 2015-04-05 DIAGNOSIS — I890 Lymphedema, not elsewhere classified: Secondary | ICD-10-CM | POA: Diagnosis not present

## 2015-04-05 NOTE — Therapy (Signed)
[place], [place], [address] Phone: [phone]   Fax:  [phone]  Physical Therapy Treatment  Patient Details  Name: MAREESA Miller MRN: [id number] Date of Birth: 1951-02-03 Referring Provider:  Christain Sacramento, MD  Encounter Date: 04/05/2015      PT End of Session - 04/05/15 1619    Visit Number 24   Number of Visits 30   Authorization Type Medicare (gcodes on visit #20)   Authorization - Visit Number 24   Authorization - Number of Visits 30   PT Start Time 7711   PT Stop Time 1430   PT Time Calculation (min) 85 min   Activity Tolerance Patient tolerated treatment well      No past medical history on file.  No past surgical history on file.  There were no vitals filed for this visit.  Visit Diagnosis:  Lymphedema      Subjective Assessment - 04/05/15 1616    Subjective Pt states that she got measured for her compression garment today.  States that for now they are only going to measure from the knees and below.  Pt states she obtained compression for her abdominal via an abdominal binder.  Pt has noticed that she is not voiding as much following sessions compared to earlier sessions.    Pertinent History Pt is in renal falure and is on six different diuretic;; pt wasl hospitalized 5 times last year for various complications of her lymphedema. Pt has pulmonary hypertension and is on oxygen; Pt fx Lt ankle 02/2014 decreased ROM ever since          Date 12/27/2014 12/27/2014 02/18/2015 02/18/2015 03/08/2015 7/1/20016 7/15/ 03/22/2015 04/04/2014 04/05/2015   RT LT Rt LT RT  LT  RT  LT  RT  LT   MTP 23.00 23.00 23 24 23.2 23.2 23.6 24.3 27 28.7  ankle 25.4 26.2 29.50 31.00 27.10 30.40 29.00 32.40 26.20 30.40  4cm 26.60 29.00 27.40 29.70 26.70 28.70 27.20 28.90 30.20 36.30  8cm 32.80 33.90 27.50 31.80 30.00 30.50 28.90 29.80 36.40 42.40  12 cm 38.00 40.30 32.90 39.00 34.00 34.80 35.50 35.30 41.00 48.50  16cm  44.00 45.80 40.00 44.90 41.40 40.20 39.80 41.70 45.40 52.20  20cm 47.00 50.10 43.50 50.10 44.00 46.50 45.20 49.10 48.40 53.90  24cm 46.70 54.00 45.70 52.70 48.10 51.40 48.00 52.30 48.90 57.20  28cm 59.50 59.50 49.50 53.30 49.10 55.30 48.50 52.90 58.50 58.70  32cm 63.20 65.40 49.20 58.10 55.00 58.30 55.70 57.60 59.30 63.80  36cm 81.60 69.70 57.00 66.00 59.20 62.40 60.20 63.70 60.20 59.20  40cm 87.50 79.00 61.00 68.20 60.90 68.00 73.00 65.70 70.00 65.70   83.40 82.00 81.00 77.50 76.80 76.50 81.30 77.20 76.60 72.00                                                                          Sum of squares   27971.30 33484.63 28552.61 31674.62 31181.81 31944.77 33458.71 36638.70  Total Volume   8903.5445 10658.493 9088.581 10082.35 9925.482 10168.34 65790.383 33832.919                     [facility] Adult PT Treatment/Exercise - 04/05/15 0001  Manual Therapy   Manual Therapy Edema management;Manual Lymphatic Drainage (MLD)   Edema Management for bilateral LE's   Manual Lymphatic Drainage (MLD) supraclavicular; deep and superficial abdominal followed by anterior inguinal-axillary anastomsis B. B LE bandaged toes to hip using...Marland KitchenMarland Kitchen. 1/2" foam and multilayer short stretch bandages.                  PT Short Term Goals - 04/05/15 1625    PT SHORT TERM GOAL #1   Title Pt to complete HEP   Time 1   Period Weeks   Status Achieved   PT SHORT TERM GOAL #2   Title PT to be able to verbalize the signs and sx of cellulitis and the iimportance of getting antibiotics   Time 2   Period Weeks   Status Achieved   PT SHORT TERM GOAL #3   Title Pt volume to be decreased by 20%   Time 4   Period Weeks   Status Achieved   PT SHORT TERM GOAL #4   Title Pt to be able to verbalize the care of compression bandages   Time 4   Period Weeks   Status Achieved           PT Long Term Goals - 04/05/15 1625    PT LONG TERM GOAL #1   Title Pt volume to be decreased by 40%   Time 8    Period Weeks   Status On-going   PT LONG TERM GOAL #2   Title Pt to state she is able to walk for 10-15 minues    Time 8   Period Weeks   Status Achieved   PT LONG TERM GOAL #3   Title Pt to be knowledgable of the maintainace stage of lymphedema and where to obtain compression garments   Time 8   Period Weeks   Status On-going               Plan - 04/05/15 1620    Clinical Impression Statement Pt has actually gained in fluid volume.  Temperature and humidity have been very high which is definately a factor.  Pt will be seen until she acquires her garments and then discharged to a maintanance program.     PT Frequency 3x / week   PT Next Visit Plan Continue with Complete decongestive techniques until pt garments have arrived.         Problem List There are no active problems to display for this patient.   Rayetta Humphrey, PT CLT [phone] 04/05/2015, 4:26 PM  [facility] [address] [place], [place], [address] Phone: [phone]   Fax:  [phone]

## 2015-04-09 ENCOUNTER — Encounter (HOSPITAL_COMMUNITY): Payer: Medicare Other | Admitting: Physical Therapy

## 2015-04-09 ENCOUNTER — Ambulatory Visit (HOSPITAL_COMMUNITY): Payer: Medicare Other | Attending: Family Medicine | Admitting: Physical Therapy

## 2015-04-09 DIAGNOSIS — I890 Lymphedema, not elsewhere classified: Secondary | ICD-10-CM | POA: Insufficient documentation

## 2015-04-09 NOTE — Therapy (Signed)
[place], [place], [address] Phone: [phone]   Fax:  [phone]  Physical Therapy Treatment  Patient Details  Name: Stacey Miller MRN: [id number] Date of Birth: 1951/06/14 Referring Provider:  Christain Sacramento, MD  Encounter Date: 04/09/2015      PT End of Session - 04/09/15 1228    Visit Number 25   Number of Visits 30   Date for PT Re-Evaluation 04/05/15   Authorization Type Medicare (gcodes on visit #20)   Authorization - Visit Number 25   Authorization - Number of Visits 30   PT Start Time [phone]   PT Stop Time 1100   PT Time Calculation (min) 83 min   Activity Tolerance Patient tolerated treatment well      No past medical history on file.  No past surgical history on file.  There were no vitals filed for this visit.  Visit Diagnosis:  Lymphedema      Subjective Assessment - 04/09/15 1227    Subjective Doing well voided significant amounts over the weekend.             [facility] Adult PT Treatment/Exercise - 04/09/15 0001    Manual Therapy   Manual Therapy Edema management;Manual Lymphatic Drainage (MLD)   Edema Management for bilateral LE's   Manual Lymphatic Drainage (MLD) supraclavicular; deep and superficial abdominal followed by anterior inguinal-axillary anastomsis B. B LE bandaged toes to hip using...Marland KitchenMarland Kitchen. 1/2" foam and multilayer short stretch bandages.               PT Short Term Goals - 04/05/15 1625    PT SHORT TERM GOAL #1   Title Pt to complete HEP   Time 1   Period Weeks   Status Achieved   PT SHORT TERM GOAL #2   Title PT to be able to verbalize the signs and sx of cellulitis and the iimportance of getting antibiotics   Time 2   Period Weeks   Status Achieved   PT SHORT TERM GOAL #3   Title Pt volume to be decreased by 20%   Time 4   Period Weeks   Status Achieved   PT SHORT TERM GOAL #4   Title Pt to be able to verbalize the care of compression bandages   Time 4   Period Weeks   Status Achieved           PT Long Term Goals - 04/05/15 1625    PT LONG TERM GOAL #1   Title Pt volume to be decreased by 40%   Time 8   Period Weeks   Status On-going   PT LONG TERM GOAL #2   Title Pt to state she is able to walk for 10-15 minues    Time 8   Period Weeks   Status Achieved   PT LONG TERM GOAL #3   Title Pt to be knowledgable of the maintainace stage of lymphedema and where to obtain compression garments   Time 8   Period Weeks   Status On-going               Plan - 04/09/15 1228    Clinical Impression Statement Pt has decreased induration in abdominal area from wearing abdominal binder.   Pt will be continue to benefit from skilled PT for decongestive techniques for lymphedema until her garments arrive.    Pt will benefit from skilled therapeutic intervention in order to improve on the following deficits  Obesity;Increased edema   Rehab Potential Good   PT Treatment/Interventions Compression bandaging;Patient/family education;Manual techniques   PT Next Visit Plan Continue with Complete decongestive techniques until pt garments have arrived.         Problem List There are no active problems to display for this patient.   Rayetta Humphrey, PT CLT [phone] 04/09/2015, 12:31 PM  [facility] [address] [place], [place], [address] Phone: [phone]   Fax:  [phone]

## 2015-04-11 ENCOUNTER — Ambulatory Visit (HOSPITAL_COMMUNITY): Payer: Medicare Other | Admitting: Physical Therapy

## 2015-04-11 DIAGNOSIS — I890 Lymphedema, not elsewhere classified: Secondary | ICD-10-CM | POA: Diagnosis not present

## 2015-04-11 NOTE — Therapy (Signed)
[place], [place], [address] Phone: [phone]   Fax:  [phone]  Physical Therapy Treatment  Patient Details  Name: Stacey Miller MRN: [id number] Date of Birth: Mar 15, 1951 Referring Provider:  Christain Sacramento, MD  Encounter Date: 04/11/2015      PT End of Session - 04/11/15 1239    Visit Number 26   Number of Visits 30   Date for PT Re-Evaluation 04/05/15   Authorization Type Medicare (gcodes on visit #20)   Authorization - Visit Number 26   Authorization - Number of Visits 30   PT Start Time 1150   PT Stop Time 1238   PT Time Calculation (min) 48 min      No past medical history on file.  No past surgical history on file.  There were no vitals filed for this visit.  Visit Diagnosis:  Lymphedema      Subjective Assessment - 04/11/15 1237    Subjective Pt states that she iswearing her compression for her abdominal area but it keeps riding up.   Currently in Pain? Yes   Pain Score 3    Pain Location Ankle   Pain Orientation Left   Pain Descriptors / Indicators Aching   Pain Type Chronic pain             [facility] Adult PT Treatment/Exercise - 04/11/15 0001    Manual Therapy   Edema Management B wrapping from toes to groin using multiple short stretch bandages with foam.             PT Short Term Goals - 04/05/15 1625    PT SHORT TERM GOAL #1   Title Pt to complete HEP   Time 1   Period Weeks   Status Achieved   PT SHORT TERM GOAL #2   Title PT to be able to verbalize the signs and sx of cellulitis and the iimportance of getting antibiotics   Time 2   Period Weeks   Status Achieved   PT SHORT TERM GOAL #3   Title Pt volume to be decreased by 20%   Time 4   Period Weeks   Status Achieved   PT SHORT TERM GOAL #4   Title Pt to be able to verbalize the care of compression bandages   Time 4   Period Weeks   Status Achieved           PT Long Term Goals - 04/05/15 1625    PT LONG TERM GOAL #1   Title Pt volume to be decreased by 40%   Time 8   Period Weeks   Status On-going   PT LONG TERM GOAL #2   Title Pt to state she is able to walk for 10-15 minues    Time 8   Period Weeks   Status Achieved   PT LONG TERM GOAL #3   Title Pt to be knowledgable of the maintainace stage of lymphedema and where to obtain compression garments   Time 8   Period Weeks   Status On-going               Plan - 04/11/15 1240    Clinical Impression Statement Pt advices to wear cotton underwear instead of silk so that abdominal binder does not ride up as much.  Unable to complete lymph decongestive massage secondary to time restraints.   PT Next Visit Plan Continue with Complete decongestive techniques until pt garments have arrived.  Remeasure next visit.         Problem List There are no active problems to display for this patient.  Rayetta Humphrey, PT CLT [phone] 04/11/2015, 12:42 PM  [facility] [address] [place], [place], [address] Phone: [phone]   Fax:  [phone]

## 2015-04-12 ENCOUNTER — Encounter (HOSPITAL_COMMUNITY): Payer: Medicare Other | Admitting: Physical Therapy

## 2015-04-17 ENCOUNTER — Ambulatory Visit (HOSPITAL_COMMUNITY): Payer: Medicare Other | Admitting: Physical Therapy

## 2015-04-17 DIAGNOSIS — I890 Lymphedema, not elsewhere classified: Secondary | ICD-10-CM

## 2015-04-17 NOTE — Therapy (Signed)
[place], [place], [address] Phone: [phone]   Fax:  [phone]  Physical Therapy Treatment  Patient Details  Name: Stacey Miller MRN: [id number] Date of Birth: [birth year] Referring Provider:  Christain Sacramento, MD  Encounter Date: 04/17/2015      PT End of Session - 04/17/15 1544    Visit Number 27   Number of Visits 30   Date for PT Re-Evaluation 04/05/15   Authorization Type Medicare (gcodes on visit #20)   Authorization - Visit Number 53   Authorization - Number of Visits 30   PT Start Time 1435   PT Stop Time 1540   PT Time Calculation (min) 65 min      No past medical history on file.  No past surgical history on file.  There were no vitals filed for this visit.  Visit Diagnosis:  Lymphedema      Subjective Assessment - 04/17/15 1537    Subjective Pt states she received her compression garments this morning.  PT comes today with distal LE juxtas donned.  Pt reports comfort wtih garments and states the fitter reported she would measure for the top when the therapist says she is ready.   Currently in Pain? No/denies                                   PT Short Term Goals - 04/05/15 1625    PT SHORT TERM GOAL #1   Title Pt to complete HEP   Time 1   Period Weeks   Status Achieved   PT SHORT TERM GOAL #2   Title PT to be able to verbalize the signs and sx of cellulitis and the iimportance of getting antibiotics   Time 2   Period Weeks   Status Achieved   PT SHORT TERM GOAL #3   Title Pt volume to be decreased by 20%   Time 4   Period Weeks   Status Achieved   PT SHORT TERM GOAL #4   Title Pt to be able to verbalize the care of compression bandages   Time 4   Period Weeks   Status Achieved           PT Long Term Goals - 04/05/15 1625    PT LONG TERM GOAL #1   Title Pt volume to be decreased by 40%   Time 8   Period Weeks   Status On-going   PT LONG  TERM GOAL #2   Title Pt to state she is able to walk for 10-15 minues    Time 8   Period Weeks   Status Achieved   PT LONG TERM GOAL #3   Title Pt to be knowledgable of the maintainace stage of lymphedema and where to obtain compression garments   Time 8   Period Weeks   Status On-going               Plan - 04/17/15 1545    Clinical Impression Statement Pt with chapped area in inguinal crease.  Instructed to place cotton between and keep area as dry as possible.  PT with juxtas in place distal LE's with noted good fit and placement.  Inquired regarding missing thigh garments and patient stated they would measure when therapy instructs of no further decompression.  contacted fitter regarding this to go ahead and get patient measured.  PT Next Visit Plan Continue with Complete decongestive techniques until pt garments have arrived. Remeasure next visit.         Problem List There are no active problems to display for this patient.   Teena Irani, PTA/CLT [phone]  04/17/2015, 3:51 PM  [facility] [address] [place], [place], [address] Phone: [phone]   Fax:  [phone]

## 2015-04-19 ENCOUNTER — Ambulatory Visit (HOSPITAL_COMMUNITY): Payer: Medicare Other | Admitting: Physical Therapy

## 2015-04-19 DIAGNOSIS — I890 Lymphedema, not elsewhere classified: Secondary | ICD-10-CM | POA: Diagnosis not present

## 2015-04-19 NOTE — Therapy (Signed)
[facility] [place], [place], [address] Phone: [phone]   Fax:  [phone]  Physical Therapy Treatment  Patient Details  Name: Stacey Miller MRN: [id number] Date of Birth: July 12, 1951 Referring Provider:  Christain Sacramento, MD  Encounter Date: 04/19/2015      PT End of Session - 04/19/15 0945    Visit Number 28   Number of Visits 30   Date for PT Re-Evaluation 04/05/15   Authorization Type Medicare (gcodes on visit #20)   Authorization - Visit Number 28   Authorization - Number of Visits 30   PT Start Time 0845   PT Stop Time 0938   PT Time Calculation (min) 53 min   Activity Tolerance Patient tolerated treatment well      No past medical history on file.  No past surgical history on file.  There were no vitals filed for this visit.  Visit Diagnosis:  Lymphedema    04/04/2014 04/05/2015 04/19/2015 04/19/2015  RT  LT  RT  LT   27 28.7 24.1 23.7  26.20 30.40 27.70 30.20  30.20 36.30 27.00 28.00  36.40 42.40 33.00 31.80  41.00 48.50 37.30 38.10  45.40 52.20 40.80 43.40  48.40 53.90 45.70 48.20  48.90 57.20 47.70 51.60  58.50 58.70 47.90 54.30  59.30 63.80 57.30 58.20  60.20 59.20 60.90 62.10  70.00 65.70 71.00 69.30  76.60 72.00 80.30 75.20                                33458.71 36638.70 31361.41 32239.61  10650.242 11662.465 9982.6504 10262.19              [facility] Adult PT Treatment/Exercise - 04/19/15 0001    Manual Therapy   Manual Therapy Edema management   Manual therapy comments short version with supraclavicular, deep and superfical abdominal as well as routing using inguinal/axillary anastomosis.  LE not completed at this time.    Edema Management Pt has aquired her compression garment for LE which was applied followed by multilayer compression wrapping using short stretch bandages and foam to thigh area B.  Followd by application of abdominal compression using abdominal binder.                    PT Short Term Goals - 04/05/15 1625    PT SHORT TERM GOAL #1   Title Pt to complete HEP   Time 1   Period Weeks   Status Achieved   PT SHORT TERM GOAL #2   Title PT to be able to verbalize the signs and sx of cellulitis and the iimportance of getting antibiotics   Time 2   Period Weeks   Status Achieved   PT SHORT TERM GOAL #3   Title Pt volume to be decreased by 20%   Time 4   Period Weeks   Status Achieved   PT SHORT TERM GOAL #4   Title Pt to be able to verbalize the care of compression bandages   Time 4   Period Weeks   Status Achieved           PT Long Term Goals - 04/05/15 1625    PT LONG TERM GOAL #1   Title Pt volume to be decreased by 40%   Time 8   Period Weeks   Status On-going   PT LONG TERM GOAL #2   Title Pt to state  she is able to walk for 10-15 minues    Time 8   Period Weeks   Status Achieved   PT LONG TERM GOAL #3   Title Pt to be knowledgable of the maintainace stage of lymphedema and where to obtain compression garments   Time 8   Period Weeks   Status On-going               Plan - 04/19/15 0945    Clinical Impression Statement Pt has decreased in volume in B LE; 668 cc from Rt; 1400 from LT.  Decrease of volume is due to LE not thigh area.  Pt appears to be decongesting better from compression garment than from short strech.      PT Next Visit Plan We will continue to wrap until garment for thigh area are recieved from pt and then discharge.        Problem List There are no active problems to display for this patient.   Rayetta Humphrey, PT CLT [phone] 04/19/2015, 9:48 AM  [facility] [place], [place], [address] Phone: [phone]   Fax:  [phone]

## 2015-04-22 ENCOUNTER — Ambulatory Visit (HOSPITAL_COMMUNITY): Payer: Medicare Other | Admitting: Physical Therapy

## 2015-04-22 DIAGNOSIS — I890 Lymphedema, not elsewhere classified: Secondary | ICD-10-CM

## 2015-04-22 NOTE — Therapy (Signed)
[place], [place], [address] Phone: [phone]   Fax:  [phone]  Physical Therapy Treatment  Patient Details  Name: Stacey Miller MRN: [id number] Date of Birth: [birth year] Referring Provider:  Christain Sacramento, MD  Encounter Date: 04/22/2015      PT End of Session - 04/22/15 1519    Visit Number 29   Number of Visits 30   Authorization Type Medicare (gcodes on visit #20)   Authorization - Visit Number 29   Authorization - Number of Visits 30   PT Start Time 9485   PT Stop Time 1454   PT Time Calculation (min) 69 min      No past medical history on file.  No past surgical history on file.  There were no vitals filed for this visit.  Visit Diagnosis:  Lymphedema      Subjective Assessment - 04/22/15 1512    Subjective Pt comes in upset about family matters.  Pt does not have her garments donned but states that she just got out of the shower.    Pertinent History Pt is in renal falure and is on six different diuretic;; pt wasl hospitalized 5 times last year for various complications of her lymphedema. Pt has pulmonary hypertension and is on oxygen; Pt fx Lt ankle 02/2014 decreased ROM ever since    Currently in Pain? No/denies             [facility] Adult PT Treatment/Exercise - 04/22/15 0001    Manual Therapy   Manual Therapy Edema management;Manual Lymphatic Drainage (MLD)   Edema Management for bilateral LE's   Manual Lymphatic Drainage (MLD) supraclavicular; deep and superficial abdominal followed by anterior inguinal-axillary anastomsis B. Donned LE compression garments followed by  B LE bandaged knee to hip using...Marland KitchenMarland Kitchen. 1/2" foam and multilayer short stretch bandages.                PT Education - 04/22/15 1515    Education provided Yes   Education Details The need to practice donning her garments.    Methods Explanation   Comprehension Verbalized understanding          PT Short  Term Goals - 04/05/15 1625    PT SHORT TERM GOAL #1   Title Pt to complete HEP   Time 1   Period Weeks   Status Achieved   PT SHORT TERM GOAL #2   Title PT to be able to verbalize the signs and sx of cellulitis and the iimportance of getting antibiotics   Time 2   Period Weeks   Status Achieved   PT SHORT TERM GOAL #3   Title Pt volume to be decreased by 20%   Time 4   Period Weeks   Status Achieved   PT SHORT TERM GOAL #4   Title Pt to be able to verbalize the care of compression bandages   Time 4   Period Weeks   Status Achieved           PT Long Term Goals - 04/05/15 1625    PT LONG TERM GOAL #1   Title Pt volume to be decreased by 40%   Time 8   Period Weeks   Status On-going   PT LONG TERM GOAL #2   Title Pt to state she is able to walk for 10-15 minues    Time 8   Period Weeks   Status Achieved   PT LONG TERM GOAL #  3   Title Pt to be knowledgable of the maintainace stage of lymphedema and where to obtain compression garments   Time 8   Period Weeks   Status On-going               Plan - 04/22/15 1516    Clinical Impression Statement Pt Lt ankle with noted increased edema pt states that her ankle has been acting up,( fx that ankle last year)    PT Next Visit Plan Continue with Manual and wrapping until pt aquires thigh compression.         Problem List There are no active problems to display for this patient.   Rayetta Humphrey, PT CLT [phone] 04/22/2015, 3:22 PM  [facility] [address] [place], [place], [address] Phone: [phone]   Fax:  [phone]

## 2015-04-24 ENCOUNTER — Ambulatory Visit (HOSPITAL_COMMUNITY): Payer: Medicare Other | Admitting: Physical Therapy

## 2015-04-24 DIAGNOSIS — I890 Lymphedema, not elsewhere classified: Secondary | ICD-10-CM | POA: Diagnosis not present

## 2015-04-24 NOTE — Therapy (Signed)
[place], [place], [address] Phone: [phone]   Fax:  [phone]  Physical Therapy Treatment  Patient Details  Name: Stacey Miller MRN: [id number] Date of Birth: 1951/01/10 Referring Provider:  Christain Sacramento, MD  Encounter Date: 04/24/2015      PT End of Session - 04/24/15 1604    Visit Number 30   Number of Visits 40   Date for PT Re-Evaluation 04/05/15   Authorization Type Medicare (gcodes on visit #30)   Authorization - Visit Number 87   Authorization - Number of Visits 40   PT Start Time 1440   PT Stop Time 1542   PT Time Calculation (min) 62 min   Activity Tolerance Patient tolerated treatment well   Behavior During Therapy [facility] for tasks assessed/performed      No past medical history on file.  No past surgical history on file.  There were no vitals filed for this visit.  Visit Diagnosis:  Lymphedema      Subjective Assessment - 04/24/15 1602    Subjective Pt states she is not voiding more during the day but at night she is being woken up every two hours due to needing to void.    Pertinent History Pt is in renal falure and is on six different diuretic;; pt wasl hospitalized 5 times last year for various complications of her lymphedema. Pt has pulmonary hypertension and is on oxygen; Pt fx Lt ankle 02/2014 decreased ROM ever since    How long can you sit comfortably? no problem   How long can you stand comfortably? five minues was less than five.    How long can you walk comfortably? ten minutes was 6    Currently in Pain? No/denies                         [facility] Adult PT Treatment/Exercise - 04/24/15 0001    Manual Therapy   Manual Therapy Edema management;Manual Lymphatic Drainage (MLD)   Edema Management for bilateral LE's   Manual Lymphatic Drainage (MLD) supraclavicular; deep and superficial abdominal followed by anterior inguinal-axillary anastomsis B. Donned LE  compression garments followed by  B LE bandaged knee to hip using...Marland KitchenMarland Kitchen. 1/2" foam and multilayer short stretch bandages.                  PT Short Term Goals - 04/05/15 1625    PT SHORT TERM GOAL #1   Title Pt to complete HEP   Time 1   Period Weeks   Status Achieved   PT SHORT TERM GOAL #2   Title PT to be able to verbalize the signs and sx of cellulitis and the iimportance of getting antibiotics   Time 2   Period Weeks   Status Achieved   PT SHORT TERM GOAL #3   Title Pt volume to be decreased by 20%   Time 4   Period Weeks   Status Achieved   PT SHORT TERM GOAL #4   Title Pt to be able to verbalize the care of compression bandages   Time 4   Period Weeks   Status Achieved           PT Long Term Goals - 04/05/15 1625    PT LONG TERM GOAL #1   Title Pt volume to be decreased by 40%   Time 8   Period Weeks   Status On-going   PT LONG  TERM GOAL #2   Title Pt to state she is able to walk for 10-15 minues    Time 8   Period Weeks   Status Achieved   PT LONG TERM GOAL #3   Title Pt to be knowledgable of the maintainace stage of lymphedema and where to obtain compression garments   Time 8   Period Weeks   Status On-going               Plan - May 10, 2015 1605    Clinical Impression Statement Pt is decongesting better with wraps than with short stretch bandages.  Pt states [facility] has been attempting to contact pt regarding fitting of thighs for garments but pt has been in a family crisis and has not been able to return the phone call to set up an apppointment but states she is going to do so prior to returning to  next visit.  Pt will continue to need skilled therapy for manual decongestion and wrapping untl pt can obtain thigh compression bandages as well.    Pt will benefit from skilled therapeutic intervention in order to improve on the following deficits Obesity;Increased edema   PT Frequency 3x / week   PT Duration 12 weeks   PT  Treatment/Interventions Compression bandaging;Patient/family education;Manual techniques   PT Next Visit Plan Remeasure for volume    Consulted and Agree with Plan of Care Patient          G-Codes - 05/10/15 1609    Functional Assessment Tool Used clinical judgement -volumes   Functional Limitation Other PT primary   Other PT Primary Current Status (A5790) At least 40 percent but less than 60 percent impaired, limited or restricted   Other PT Primary Goal Status (X8333) At least 40 percent but less than 60 percent impaired, limited or restricted      Physical Therapy Progress Note  Dates of Reporting Period: 12/27/2014  to 2015/05/10  Objective Reports of Subjective Statement:Pt volume has decreased and appears to have maximized the amount of volume reduction the pt will obtained.  Garment fitter has been contacted.  Pt fitted and received LE garment but not thigh garments.   Fitter will fit for thigh garments later this week or early next week.   Objective Measurements:  04/04/2014 04/05/2015 04/19/2015 04/19/2015  RT  LT  RT  LT   27 28.7 24.1 23.7  26.20 30.40 27.70 30.20  30.20 36.30 27.00 28.00  36.40 42.40 33.00 31.80  41.00 48.50 37.30 38.10  45.40 52.20 40.80 43.40  48.40 53.90 45.70 48.20  48.90 57.20 47.70 51.60  58.50 58.70 47.90 54.30  59.30 63.80 57.30 58.20  60.20 59.20 60.90 62.10  70.00 65.70 71.00 69.30  76.60 72.00 80.30 75.20                                33458.71 36638.70 31361.41 32239.61  10650.242 83291.916 9982.6504 10262.19     Goal Update: as above   Plan: as above   Reason Skilled Services are Required: maintain volume until compression garments can be obtained to avoid increased volume and risk of cellulitis again.    Problem List There are no active problems to display for this patient.   Rayetta Humphrey, PT CLT [phone] 2015-05-10, 4:10 PM  [place] [address] [place],  [place], [address] Phone: [phone]   Fax:  [phone]

## 2015-05-03 ENCOUNTER — Ambulatory Visit (HOSPITAL_COMMUNITY): Payer: Medicare Other | Admitting: Physical Therapy

## 2015-05-06 ENCOUNTER — Encounter (HOSPITAL_COMMUNITY): Payer: Medicare Other | Admitting: Physical Therapy

## 2015-05-08 ENCOUNTER — Ambulatory Visit (HOSPITAL_COMMUNITY): Payer: Medicare Other | Admitting: Physical Therapy

## 2015-05-10 ENCOUNTER — Encounter (HOSPITAL_COMMUNITY): Payer: Medicare Other | Admitting: Physical Therapy

## 2015-05-14 ENCOUNTER — Ambulatory Visit (HOSPITAL_COMMUNITY): Payer: Medicare Other | Attending: Family Medicine | Admitting: Physical Therapy

## 2015-05-14 DIAGNOSIS — I890 Lymphedema, not elsewhere classified: Secondary | ICD-10-CM | POA: Insufficient documentation

## 2015-05-14 NOTE — Therapy (Signed)
[place], [place], [address] Phone: [phone]   Fax:  [phone]  Physical Therapy Treatment  Patient Details  Name: Stacey Miller MRN: [id number] Date of Birth: 1951/02/23 Referring Abra Lingenfelter:  Christain Sacramento, MD  Encounter Date: 05/14/2015      PT End of Session - 05/14/15 1749    Visit Number 31   Number of Visits 40   Date for PT Re-Evaluation 06/06/15   Authorization Type Medicare (gcodes on visit #30)   Authorization - Visit Number 31   Authorization - Number of Visits 40   PT Start Time 1350   PT Stop Time 1515   PT Time Calculation (min) 85 min   Activity Tolerance Patient tolerated treatment well   Behavior During Therapy [facility] for tasks assessed/performed      No past medical history on file.  No past surgical history on file.  There were no vitals filed for this visit.  Visit Diagnosis:  Lymphedema      Subjective Assessment - 05/14/15 1748    Subjective Pt returns today after 3 week absence due to a virus.  Pt states she has been very sick and has lost 18 pounds.  Pt states she has not got in touch with garment fitter at this time.    Currently in Pain? No/denies            04/19/2015  05/14/2015   right left right left  24.10 23.70 22.5 22.7  27.7 30.2 26.00 31.00  27.00 28.00 24.50 26.00  33.00 31.80 28.50 27.70  37.30 38.10 35.00 34.40  40.80 43.40 39.80 40.50  45.70 48.20 43.00 45.80  47.70 51.60 45.70 49.50  47.90 54.30 47.50 50.00  57.30 58.20 48.00 53.00  60.90 62.10 56.00 54.00  71.00 69.30 68.00 62.00  80.30 75.20 77.00 68.00                                31361.41 32239.61 27590.53 26984.08  9982.65 10262.19 8782.342 8589.[place] Adult PT Treatment/Exercise - 05/14/15 1802    Manual Therapy   Manual Therapy Edema management;Manual Lymphatic Drainage (MLD)   Edema Management for bilateral LE's   Manual Lymphatic Drainage (MLD)  supraclavicular; deep and superficial abdominal followed by anterior inguinal-axillary anastomsis B. Donned LE compression garments followed by  B LE bandaged knee to hip using...Marland KitchenMarland Kitchen. 1/2" foam and multilayer short stretch bandages.                  PT Short Term Goals - 04/05/15 1625    PT SHORT TERM GOAL #1   Title Pt to complete HEP   Time 1   Period Weeks   Status Achieved   PT SHORT TERM GOAL #2   Title PT to be able to verbalize the signs and sx of cellulitis and the iimportance of getting antibiotics   Time 2   Period Weeks   Status Achieved   PT SHORT TERM GOAL #3   Title Pt volume to be decreased by 20%   Time 4   Period Weeks   Status Achieved   PT SHORT TERM GOAL #4   Title Pt to be able to verbalize the care of compression bandages   Time 4   Period Weeks   Status Achieved  PT Long Term Goals - 04/05/15 1625    PT LONG TERM GOAL #1   Title Pt volume to be decreased by 40%   Time 8   Period Weeks   Status On-going   PT LONG TERM GOAL #2   Title Pt to state she is able to walk for 10-15 minues    Time 8   Period Weeks   Status Achieved   PT LONG TERM GOAL #3   Title Pt to be knowledgable of the maintainace stage of lymphedema and where to obtain compression garments   Time 8   Period Weeks   Status On-going               Plan - 05/14/15 1750    Clinical Impression Statement therapist contacted garment fitter during session today and able to set up appointment for 9/8 at 12:30 to measure upper thighs.  Pt remeasured today with additional reduction of 1,200 cc from Rt LE and 1,673 cc from [place].  Pt has lost 18# due to virus the past 3 weeks so this high number is expected.  Pt    PT Next Visit Plan Continue with MLD and bandaging of Upper thighs until remaining compression garments are received.    Consulted and Agree with Plan of Care Patient        Problem List There are no active problems to display for this  patient.   Teena Irani, PTA/CLT [phone]  05/14/2015, 6:03 PM  [place] [address] [place], [place], [address] Phone: [phone]   Fax:  [phone]

## 2015-05-16 ENCOUNTER — Ambulatory Visit (HOSPITAL_COMMUNITY): Payer: Medicare Other | Admitting: Physical Therapy

## 2015-05-16 DIAGNOSIS — I890 Lymphedema, not elsewhere classified: Secondary | ICD-10-CM | POA: Diagnosis not present

## 2015-05-16 NOTE — Therapy (Signed)
[place], [place], [address] Phone: [phone]   Fax:  [phone]  Physical Therapy Treatment  Patient Details  Name: Stacey Miller MRN: [id number] Date of Birth: [birth year] Referring Provider:  Christain Sacramento, MD  Encounter Date: 05/16/2015      PT End of Session - 05/16/15 1436    Visit Number 32   Number of Visits 40   Date for PT Re-Evaluation 06/06/15   Authorization Type Medicare (gcodes on visit #30)   Authorization - Visit Number 40   PT Start Time 1300   PT Stop Time 1430   PT Time Calculation (min) 90 min   Activity Tolerance Patient tolerated treatment well      No past medical history on file.  No past surgical history on file.  There were no vitals filed for this visit.  Visit Diagnosis:  Lymphedema      Subjective Assessment - 05/16/15 1433    Subjective Pt states that she was fitted for her thigh compression today.  Pt states that the velcro from one of her LE wraps got lost.     Pertinent History Pt is in renal falure and is on six different diuretic;; pt wasl hospitalized 5 times last year for various complications of her lymphedema. Pt has pulmonary hypertension and is on oxygen; Pt fx Lt ankle 02/2014 decreased ROM ever since    Currently in Pain? No/denies                [facility] Adult PT Treatment/Exercise - 05/16/15 0001    Manual Therapy   Manual Therapy Edema management;Manual Lymphatic Drainage (MLD)   Edema Management for bilateral LE's   Manual Lymphatic Drainage (MLD) supraclavicular; deep and superficial abdominal followed by anterior inguinal-axillary anastomsis B. Donned LE compression garments followed by  B LE bandaged knee to hip using...Marland KitchenMarland Kitchen. 1/2" foam and multilayer short stretch bandages.                  PT Short Term Goals - 04/05/15 1625    PT SHORT TERM GOAL #1   Title Pt to complete HEP   Time 1   Period Weeks   Status Achieved   PT SHORT  TERM GOAL #2   Title PT to be able to verbalize the signs and sx of cellulitis and the iimportance of getting antibiotics   Time 2   Period Weeks   Status Achieved   PT SHORT TERM GOAL #3   Title Pt volume to be decreased by 20%   Time 4   Period Weeks   Status Achieved   PT SHORT TERM GOAL #4   Title Pt to be able to verbalize the care of compression bandages   Time 4   Period Weeks   Status Achieved           PT Long Term Goals - 04/05/15 1625    PT LONG TERM GOAL #1   Title Pt volume to be decreased by 40%   Time 8   Period Weeks   Status On-going   PT LONG TERM GOAL #2   Title Pt to state she is able to walk for 10-15 minues    Time 8   Period Weeks   Status Achieved   PT LONG TERM GOAL #3   Title Pt to be knowledgable of the maintainace stage of lymphedema and where to obtain compression garments   Time 8   Period Weeks  Status On-going               Plan - 05/16/15 1437    Clinical Impression Statement Pt fitted for thigh compression today.  Pt lost the velcro from one of her LE compression garments but failed to tell fitter.  Therapist attempted to use velcro from splint material, however, after pt stood for five minutes the velcro disengaged from the tension.  Therapist urged pt to call fitter to see if they could send her any extra velcro as garment will not work properly if it is not cinched down.    Pt will benefit from skilled therapeutic intervention in order to improve on the following deficits Obesity;Increased edema   PT Next Visit Plan Continue with MLD and bandaging of Upper thighs until remaining compression garments are received.         Problem List There are no active problems to display for this patient.  Rayetta Humphrey, PT CLT [phone] 05/16/2015, 2:42 PM  [place] [address] [place], [place], [address] Phone: [phone]   Fax:  [phone]

## 2015-05-20 ENCOUNTER — Ambulatory Visit (HOSPITAL_COMMUNITY): Payer: Medicare Other | Admitting: Physical Therapy

## 2015-05-21 ENCOUNTER — Telehealth (HOSPITAL_COMMUNITY): Payer: Self-pay | Admitting: Physical Therapy

## 2015-05-21 NOTE — Telephone Encounter (Signed)
Called pt to see if she wanted to come in to treatment tomorrow since she cancelled Mondays appointment.  Pt is going for a check up at [facility] tomorrow.   Rayetta Humphrey, [place] CLT [phone]

## 2015-05-22 ENCOUNTER — Encounter (HOSPITAL_COMMUNITY): Payer: Medicare Other | Admitting: Physical Therapy

## 2015-05-24 ENCOUNTER — Ambulatory Visit (HOSPITAL_COMMUNITY): Payer: Medicare Other | Admitting: Physical Therapy

## 2015-05-27 ENCOUNTER — Ambulatory Visit (HOSPITAL_COMMUNITY): Payer: Medicare Other | Admitting: Physical Therapy

## 2015-05-27 DIAGNOSIS — I890 Lymphedema, not elsewhere classified: Secondary | ICD-10-CM | POA: Diagnosis not present

## 2015-05-27 NOTE — Therapy (Signed)
[place], [place], [address] Phone: [phone]   Fax:  [phone]  Physical Therapy Treatment  Patient Details  Name: Stacey Miller MRN: [id number] Date of Birth: 1950/10/14 Referring Provider:  Christain Sacramento, MD  Encounter Date: 05/27/2015      PT End of Session - 05/27/15 1627    Visit Number 33   Number of Visits 40   Date for PT Re-Evaluation 06/06/15   Authorization - Visit Number 77   Authorization - Number of Visits 40   PT Start Time 1310   PT Stop Time 1420   PT Time Calculation (min) 70 min   Activity Tolerance Patient tolerated treatment well   Behavior During Therapy [facility] for tasks assessed/performed      No past medical history on file.  No past surgical history on file.  There were no vitals filed for this visit.  Visit Diagnosis:  Lymphedema      Subjective Assessment - 05/27/15 1621    Subjective Pt states that she had the stomach virus for two weeks and has gotten weak.  States that she fell twice over the weekend.  Pt has been measured for her thigh compression and they were ordered last week.   Pertinent History Pt is in renal falure and is on six different diuretic;; pt wasl hospitalized 5 times last year for various complications of her lymphedema. Pt has pulmonary hypertension and is on oxygen; Pt fx Lt ankle 02/2014 decreased ROM ever since    How long can you sit comfortably? no problem   How long can you stand comfortably? Five minutes was less than five minutes.    How long can you walk comfortably? pt has not been walking due to the flu and feeling weak.  Pt was encouraged to start walking again even if she is only walking for 3-5 minutes at a time.    Currently in Pain? Yes   Pain Score 3    Pain Location --  shouler, hip buttock secondary to fall   Pain Orientation Right   Pain Descriptors / Indicators Aching   Pain Type Acute pain               [facility] Adult PT  Treatment/Exercise - 05/27/15 0001    Manual Therapy   Manual Therapy Edema management;Manual Lymphatic Drainage (MLD)   Edema Management for bilateral LE's   Manual Lymphatic Drainage (MLD) supraclavicular; deep and superficial abdominal followed by anterior inguinal-axillary anastomsis B. Donned LE compression garments followed by  B LE bandaged knee to hip using...Marland KitchenMarland Kitchen. 1/2" foam and multilayer short stretch bandages.                PT Education - 05/27/15 1626    Education provided Yes   Education Details to begin walking again to build strength up    Person(s) Educated Patient   Methods Explanation   Comprehension Verbalized understanding          PT Short Term Goals - 05/27/15 1629    PT SHORT TERM GOAL #1   Title Pt to complete HEP   Time 1   Period Weeks   Status Achieved   PT SHORT TERM GOAL #2   Title PT to be able to verbalize the signs and sx of cellulitis and the iimportance of getting antibiotics   Time 2   Period Weeks   Status Achieved   PT SHORT TERM GOAL #3   Title  Pt volume to be decreased by 20%   Time 4   Period Weeks   Status Achieved           PT Long Term Goals - 05/27/15 1630    PT LONG TERM GOAL #1   Title Pt volume to be decreased by 40%   Time 8   Period Weeks   Status Partially Met   PT LONG TERM GOAL #2   Title Pt to state she is able to walk for 10-15 minues    Time 8   Period Weeks   Status Achieved  unable at this time due to recent falls.   PT LONG TERM GOAL #3   Title Pt to be knowledgable of the maintainace stage of lymphedema and where to obtain compression garments   Time 8   Period Weeks   Status Achieved               Plan - 05/27/15 1628    Clinical Impression Statement Pt late for appointment therefore she will be remeasured next treatment.  Pt has been fitted for thigh high compression and they have been ordered.  Skilled therapy will no longer be needed once they have arrived as pt volume reduction  has stabilized.    Pt will benefit from skilled therapeutic intervention in order to improve on the following deficits Obesity;Increased edema   PT Treatment/Interventions Compression bandaging;Patient/family education;Manual techniques   PT Next Visit Plan Continue with MLD and bandaging of Upper thighs until remaining compression garments are received.         Problem List There are no active problems to display for this patient.  Rayetta Humphrey, PT CLT [phone]  05/27/2015, 4:31 PM  [facility] [place] [address] [place], [place], [address] Phone: [phone]   Fax:  [phone]

## 2015-05-29 ENCOUNTER — Ambulatory Visit (HOSPITAL_COMMUNITY): Payer: Medicare Other | Admitting: Physical Therapy

## 2015-05-29 DIAGNOSIS — I890 Lymphedema, not elsewhere classified: Secondary | ICD-10-CM | POA: Diagnosis not present

## 2015-05-29 NOTE — Therapy (Signed)
[place], [place], [address] Phone: [phone]   Fax:  [phone]  Physical Therapy Treatment  Patient Details  Name: Stacey Miller MRN: [id number] Date of Birth: 1951-06-27 Referring Provider:  Christain Sacramento, MD  Encounter Date: 05/29/2015      PT End of Session - 05/29/15 1404    Visit Number 34   Number of Visits 40   Date for PT Re-Evaluation 06/06/15   Authorization - Visit Number 59   Authorization - Number of Visits 40   PT Start Time 1300   PT Stop Time 1400   PT Time Calculation (min) 60 min   Activity Tolerance Patient tolerated treatment well   Behavior During Therapy [facility] for tasks assessed/performed      No past medical history on file.  No past surgical history on file.  There were no vitals filed for this visit.  Visit Diagnosis:  Lymphedema      Subjective Assessment - 05/29/15 1408    Subjective PT states she fell over the weekend after loosing her balance and was unable to get herself off the floor.  Reports she slept in the floor and scooted over to get the phone the next day.  Pt reports EMS had to get her up off the floor.     Currently in Pain? No/denies                         [facility] Adult PT Treatment/Exercise - 05/29/15 1410    Manual Therapy   Manual Therapy Edema management;Manual Lymphatic Drainage (MLD)   Edema Management for bilateral LE's   Manual Lymphatic Drainage (MLD) supraclavicular; deep and superficial abdominal followed by anterior inguinal-axillary anastomsis B. Donned LE compression garments followed by  B LE bandaged knee to hip using...Marland KitchenMarland Kitchen. 1/2" foam and multilayer short stretch bandages.                  PT Short Term Goals - 05/27/15 1629    PT SHORT TERM GOAL #1   Title Pt to complete HEP   Time 1   Period Weeks   Status Achieved   PT SHORT TERM GOAL #2   Title PT to be able to verbalize the signs and sx of cellulitis and  the iimportance of getting antibiotics   Time 2   Period Weeks   Status Achieved   PT SHORT TERM GOAL #3   Title Pt volume to be decreased by 20%   Time 4   Period Weeks   Status Achieved           PT Long Term Goals - 05/27/15 1630    PT LONG TERM GOAL #1   Title Pt volume to be decreased by 40%   Time 8   Period Weeks   Status Partially Met   PT LONG TERM GOAL #2   Title Pt to state she is able to walk for 10-15 minues    Time 8   Period Weeks   Status Achieved  unable at this time due to recent falls.   PT LONG TERM GOAL #3   Title Pt to be knowledgable of the maintainace stage of lymphedema and where to obtain compression garments   Time 8   Period Weeks   Status Achieved               Plan - 05/29/15 1405    Clinical Impression Statement  Manual lymph drainage completed for bilateral LE's followed by donning of juxtafits to distal LE and short stretch bandaging/foam to upper thighs bilaterally.  discussed adding physical therapy to increase LE strength, balance and functional moblity when discontinue lymphedema care.  Pt may wish to participate wtih therapy services closer to home but feel she would benefit to instruct on floor transfers and to prevent falls.  Pt verbalized this may be beneficial.  Pt with large black bruise on Rt pectoral region and Lt bicep areas.    Pt will benefit from skilled therapeutic intervention in order to improve on the following deficits Obesity;Increased edema   PT Treatment/Interventions Compression bandaging;Patient/family education;Manual techniques   PT Next Visit Plan Continue with MLD and bandaging of Upper thighs until remaining compression garments are received.  Re-measure next session.        Problem List There are no active problems to display for this patient.   Teena Irani, PTA/CLT [phone]  05/29/2015, 2:11 PM  [facility] [address] [place], [place],  [address] Phone: [phone]   Fax:  [phone]

## 2015-05-31 ENCOUNTER — Ambulatory Visit (HOSPITAL_COMMUNITY): Payer: Medicare Other | Admitting: Physical Therapy

## 2015-05-31 DIAGNOSIS — I890 Lymphedema, not elsewhere classified: Secondary | ICD-10-CM | POA: Diagnosis not present

## 2015-05-31 NOTE — Therapy (Signed)
[place], [place], [address] Phone: [phone]   Fax:  [phone]  Physical Therapy Treatment  Patient Details  Name: Stacey Miller MRN: [id number] Date of Birth: Sep 30, 1950 Referring Provider:  Christain Sacramento, MD  Encounter Date: 05/31/2015      PT End of Session - 05/31/15 1419    Visit Number 35   Number of Visits 50   Date for PT Re-Evaluation 06/06/15   Authorization - Visit Number 36   Authorization - Number of Visits 40   PT Start Time 1300   PT Stop Time 1400   PT Time Calculation (min) 60 min   Activity Tolerance Patient tolerated treatment well      No past medical history on file.  No past surgical history on file.  There were no vitals filed for this visit.  Visit Diagnosis:  Lymphedema      Subjective Assessment - 05/31/15 1416    Subjective Pt states that she has began to walk to increase her strength and endurance again,.  She is now walking for 6 minutes straight which is significantly less than before she had her virus but she is felling better.    Currently in Pain? No/denies               [facility] Adult PT Treatment/Exercise - 05/31/15 1417    Manual Therapy   Manual Therapy Edema management;Manual Lymphatic Drainage (MLD)   Edema Management for bilateral LE's   Manual Lymphatic Drainage (MLD) supraclavicular; deep and superficial abdominal followed by anterior inguinal-axillary anastomsis B. Donned LE compression garments followed by  B LE bandaged knee to hip using...Marland KitchenMarland Kitchen. 1/2" foam and multilayer short stretch bandages.                PT Education - 05/31/15 1418    Education provided Yes   Education Details to place hands on abdomen and provide pressure with exhale while pt is practicing her diaphragmic breathing.    Person(s) Educated Patient   Methods Explanation;Demonstration   Comprehension Verbalized understanding;Returned demonstration          PT  Short Term Goals - 05/27/15 1629    PT SHORT TERM GOAL #1   Title Pt to complete HEP   Time 1   Period Weeks   Status Achieved   PT SHORT TERM GOAL #2   Title PT to be able to verbalize the signs and sx of cellulitis and the iimportance of getting antibiotics   Time 2   Period Weeks   Status Achieved   PT SHORT TERM GOAL #3   Title Pt volume to be decreased by 20%   Time 4   Period Weeks   Status Achieved           PT Long Term Goals - 05/27/15 1630    PT LONG TERM GOAL #1   Title Pt volume to be decreased by 40%   Time 8   Period Weeks   Status Partially Met   PT LONG TERM GOAL #2   Title Pt to state she is able to walk for 10-15 minues    Time 8   Period Weeks   Status Achieved  unable at this time due to recent falls.   PT LONG TERM GOAL #3   Title Pt to be knowledgable of the maintainace stage of lymphedema and where to obtain compression garments   Time 8   Period Weeks   Status  Achieved               Plan - 05/31/15 1419    Clinical Impression Statement Extra time was spent on abdominal as pt has significant induration in abdominal area.  Pt urged to increase her waling at home until she is up to 15 minutes.  LE with decreased edema and induration.   PT Next Visit Plan Pt to be re-measured next week, ( Therapist does not expect significant changes therefore pt can be measured every 2 weeks).  Review using paint roller to assit in decongestion of abdominal area at home.         Problem List There are no active problems to display for this patient. Rayetta Humphrey, PT CLT [phone] 05/31/2015, 2:24 PM  [facility] [address] [place], [place], [address] Phone: [phone]   Fax:  [phone]

## 2015-06-03 ENCOUNTER — Ambulatory Visit (HOSPITAL_COMMUNITY): Payer: Medicare Other | Admitting: Physical Therapy

## 2015-06-03 DIAGNOSIS — I890 Lymphedema, not elsewhere classified: Secondary | ICD-10-CM | POA: Diagnosis not present

## 2015-06-03 NOTE — Therapy (Signed)
[place], [place], [address] Phone: [phone]   Fax:  [phone]  Physical Therapy Treatment  Patient Details  Name: Stacey Miller MRN: [id number] Date of Birth: 1950-09-28 Referring Provider:  Christain Sacramento, MD  Encounter Date: 06/03/2015      PT End of Session - 06/03/15 1635    Visit Number 36   Number of Visits 50   Date for PT Re-Evaluation 06/06/15   Authorization - Visit Number 35   Authorization - Number of Visits 40   PT Start Time 1300   PT Stop Time 1415   PT Time Calculation (min) 75 min   Activity Tolerance Patient tolerated treatment well   Behavior During Therapy [facility] for tasks assessed/performed      No past medical history on file.  No past surgical history on file.  There were no vitals filed for this visit.  Visit Diagnosis:  Lymphedema      Subjective Assessment - 06/03/15 1633    Subjective Pt states her stomach is sore today . Reports she is eager to get her thigh garments.  No pain.   Currently in Pain? No/denies                         [facility] Adult PT Treatment/Exercise - 06/03/15 1635    Manual Therapy   Manual Therapy Edema management;Manual Lymphatic Drainage (MLD)   Edema Management for bilateral LE's   Manual Lymphatic Drainage (MLD) supraclavicular; deep and superficial abdominal followed by anterior inguinal-axillary anastomsis B. Donned LE compression garments followed by  B LE bandaged knee to hip using...Marland KitchenMarland Kitchen. 1/2" foam and multilayer short stretch bandages.                PT Education - 06/03/15 1634    Education provided Yes   Education Details Urged to begin a water aerobics class or equivalent to increase activity tolerance and improve overall mobility.    Person(s) Educated Patient   Methods Explanation   Comprehension Verbalized understanding          PT Short Term Goals - 05/27/15 1629    PT SHORT TERM GOAL #1   Title Pt  to complete HEP   Time 1   Period Weeks   Status Achieved   PT SHORT TERM GOAL #2   Title PT to be able to verbalize the signs and sx of cellulitis and the iimportance of getting antibiotics   Time 2   Period Weeks   Status Achieved   PT SHORT TERM GOAL #3   Title Pt volume to be decreased by 20%   Time 4   Period Weeks   Status Achieved           PT Long Term Goals - 05/27/15 1630    PT LONG TERM GOAL #1   Title Pt volume to be decreased by 40%   Time 8   Period Weeks   Status Partially Met   PT LONG TERM GOAL #2   Title Pt to state she is able to walk for 10-15 minues    Time 8   Period Weeks   Status Achieved  unable at this time due to recent falls.   PT LONG TERM GOAL #3   Title Pt to be knowledgable of the maintainace stage of lymphedema and where to obtain compression garments   Time 8   Period Weeks   Status Achieved  Plan - 06/03/15 1636    Clinical Impression Statement Manual lymph drainage completed for bilateral LE's.  Pt with noted difficulty completing bed mobiliy requiring therapist assistance to sit up and position self in supine.  Encouraged patient to look into purchasing functional bed rather than continued use of lift chair and also to begin participation in aquatics/aeobics.  Pt verbalized understanding.    PT Next Visit Plan Pt to be re-measured end of this week and can be measured every 2 weeks at this point.  Also decreased to 2X week visits until gets garments.        Problem List There are no active problems to display for this patient.   Teena Irani, PTA/CLT [phone]  06/03/2015, 4:41 PM  [facility] [address] [place], [place], [address] Phone: [phone]   Fax:  [phone]

## 2015-06-05 ENCOUNTER — Ambulatory Visit (HOSPITAL_COMMUNITY): Payer: Medicare Other | Admitting: Physical Therapy

## 2015-06-05 DIAGNOSIS — I890 Lymphedema, not elsewhere classified: Secondary | ICD-10-CM | POA: Diagnosis not present

## 2015-06-05 NOTE — Therapy (Signed)
[facility] [address] [place], [place], [address] Phone: [phone]   Fax:  [phone]  Physical Therapy Treatment  Patient Details  Name: Stacey Miller MRN: [id number] Date of Birth: 12/27/1950 Referring Provider:  Wilson, Fred H, MD  Encounter Date: 06/05/2015      PT End of Session - 06/05/15 1458    Visit Number 37   Number of Visits 50   Date for PT Re-Evaluation 06/17/15   Authorization Type Medicare (gcodes on visit #30)   Authorization - Visit Number 37   Authorization - Number of Visits 40   PT Start Time 1300   PT Stop Time 1415   PT Time Calculation (min) 75 min   Activity Tolerance Patient tolerated treatment well   Behavior During Therapy [facility] for tasks assessed/performed      No past medical history on file.  No past surgical history on file.  There were no vitals filed for this visit.  Visit Diagnosis:  Lymphedema      Subjective Assessment - 06/05/15 1455    Subjective Pt forgot most of her bandages today.  Only comes today with limited short stretch and without foam or juxtas for distal LE's.  No pain reported today, only c/o abdominal distention.    Currently in Pain? No/denies                         [facility] Adult PT Treatment/Exercise - 06/05/15 1455    Manual Therapy   Manual Therapy Edema management;Manual Lymphatic Drainage (MLD)   Edema Management for bilateral LE's   Manual Lymphatic Drainage (MLD) supraclavicular; deep and superficial abdominal followed by anterior inguinal-axillary anastomsis B.  B LE bandaged knee to hip using 1/4" foam and multilayer short stretch bandages.                  PT Short Term Goals - 05/27/15 1629    PT SHORT TERM GOAL #1   Title Pt to complete HEP   Time 1   Period Weeks   Status Achieved   PT SHORT TERM GOAL #2   Title PT to be able to verbalize the signs and sx of cellulitis and the iimportance of getting antibiotics   Time 2    Period Weeks   Status Achieved   PT SHORT TERM GOAL #3   Title Pt volume to be decreased by 20%   Time 4   Period Weeks   Status Achieved           PT Long Term Goals - 05/27/15 1630    PT LONG TERM GOAL #1   Title Pt volume to be decreased by 40%   Time 8   Period Weeks   Status Partially Met   PT LONG TERM GOAL #2   Title Pt to state she is able to walk for 10-15 minues    Time 8   Period Weeks   Status Achieved  unable at this time due to recent falls.   PT LONG TERM GOAL #3   Title Pt to be knowledgable of the maintainace stage of lymphedema and where to obtain compression garments   Time 8   Period Weeks   Status Achieved               Plan - 06/05/15 1500    Clinical Impression Statement Continued with Manual lymph drainage for bilateral LE's.  New foam cut for bilateral thighs,   however only used 1/4" foam as 1/2" foam was not available.  Moisturized bilateral LE's and bandaged upper thighs only . Pt instructed to don juxtas on distal LE's when arrives home and bridge the upper/lowers with additional 10cm short stretch bilaterally.  Pt verbally agreed.    PT Next Visit Plan Continue to complete manual lymph drainage 2X week until patient receives garments.        Problem List There are no active problems to display for this patient.   Teena Irani, PTA/CLT [phone] 06/05/2015, 3:08 PM  [facility] [address] [place], [place], [address] Phone: [phone]   Fax:  [phone]

## 2015-06-10 ENCOUNTER — Ambulatory Visit (HOSPITAL_COMMUNITY): Payer: Medicare Other | Attending: Family Medicine | Admitting: Physical Therapy

## 2015-06-10 DIAGNOSIS — I890 Lymphedema, not elsewhere classified: Secondary | ICD-10-CM

## 2015-06-10 NOTE — Therapy (Addendum)
[place], [place], [address] Phone: [phone]   Fax:  [phone]  Physical Therapy Treatment  Patient Details  Name: Stacey Miller MRN: [id number] Date of Birth: 10-15-1950 Referring Provider:  Christain Sacramento, MD  Encounter Date: 06/10/2015      PT End of Session - 06/10/15 1419    Visit Number 38   Number of Visits 50   Date for PT Re-Evaluation 06/17/15   Authorization Type Medicare (gcodes on visit #30)   Authorization - Visit Number 59   Authorization - Number of Visits 40   PT Start Time 1302   PT Stop Time 1420   PT Time Calculation (min) 78 min   Activity Tolerance Patient tolerated treatment well      No past medical history on file.  No past surgical history on file.  There were no vitals filed for this visit.  Visit Diagnosis:  Lymphedema      Subjective Assessment - 06/10/15 1417    Subjective Pt comes to the department with no compression including the juxta garment for LE.  PT states that she is walking more walking 10 minutes three times a day.    Currently in Pain? No/denies        04/04/2014 04/05/2015 04/19/2015 04/19/2015 06/10/2015 06/10/2015  RT  LT  RT  LT  RT  LT   27 28.7 24.1 23.7 23.2 23.6  26.20 30.40 27.70 30.20 28.20 31.20  30.20 36.30 27.00 28.00 27.50 30.00  36.40 42.40 33.00 31.80 31.60 36.00  41.00 48.50 37.30 38.10 38.20 42.80  45.40 52.20 40.80 43.40 43.50 47.70  48.40 53.90 45.70 48.20 47.30 53.60  48.90 57.20 47.70 51.60 51.30 55.80  58.50 58.70 47.90 54.30 50.20 57.30  59.30 63.80 57.30 58.20 59.30 58.30  60.20 59.20 60.90 62.10 69.00 65.50  70.00 65.70 71.00 69.30 78.00 67.20  76.60 72.00 80.30 75.20 82.00 72.20                                            33458.71 36638.70 31361.41 32239.61 34914.29 34521.24  10650.242 38377.939 9982.6504 10262.19 11113.568 [id number].[facility] Adult PT Treatment/Exercise - 06/10/15 1418    Manual  Therapy   Manual Therapy Edema management;Manual Lymphatic Drainage (MLD)   Edema Management for bilateral LE's   Manual Lymphatic Drainage (MLD) supraclavicular; deep and superficial abdominal followed by anterior inguinal-axillary anastomsis B. Donned LE compression garments followed by  B LE bandaged knee to hip using...Marland KitchenMarland Kitchen. 1/2" foam and multilayer short stretch bandages.                PT Education - 06/10/15 1419    Education provided Yes   Education Details Urged to keep compression on at all times unless she is bathing.    Person(s) Educated Patient   Methods Explanation   Comprehension Verbalized understanding          PT Short Term Goals - 05/27/15 1629    PT SHORT TERM GOAL #1   Title Pt to complete HEP   Time 1   Period Weeks   Status Achieved   PT SHORT TERM GOAL #2   Title PT to be able to verbalize the signs and sx of cellulitis and the iimportance of getting antibiotics   Time 2  Period Weeks   Status Achieved   PT SHORT TERM GOAL #3   Title Pt volume to be decreased by 20%   Time 4   Period Weeks   Status Achieved           PT Long Term Goals - 05/27/15 1630    PT LONG TERM GOAL #1   Title Pt volume to be decreased by 40%   Time 8   Period Weeks   Status Partially Met   PT LONG TERM GOAL #2   Title Pt to state she is able to walk for 10-15 minues    Time 8   Period Weeks   Status Achieved  unable at this time due to recent falls.   PT LONG TERM GOAL #3   Title Pt to be knowledgable of the maintainace stage of lymphedema and where to obtain compression garments   Time 8   Period Weeks   Status Achieved               Plan - 06/10/15 1420    Clinical Impression Statement Pt has noted increased edema with Lt increased by 726 cc; RT increased by 1,131 cc. Pt LE have a slight red hue to them.  Explained the importance of constant compression to pt.    PT Next Visit Plan Continue to complete manual lymph drainage 2X week until  patient receives garments.     G 8991 Ck G 8992 CK   Problem List There are no active problems to display for this patient.   Rayetta Humphrey, PT CLT [phone] 06/10/2015, 2:22 PM  [facility] [address] [place], [place], [address] Phone: [phone]   Fax:  [phone]   PHYSICAL THERAPY DISCHARGE SUMMARY  Visits from Start of Care: 69  Current functional level related to goals / functional outcomes: Pt volume has decreased in LE but not in thigh area.   Pt is informed on chronic condition and the need to maintain constant compression. Pt has thigh and LE garments at this time and is no longer in need of skilled therapy   Remaining deficits: Thigh area did not significantly reduce; abdominal area continues to be indurated.    Education / Equipment: Breathing, need for constant compression   Plan: Patient agrees to discharge.  Patient goals were partially met. Patient is being discharged due to meeting the stated rehab goals.  ?????   Rayetta Humphrey, [place] CLT [phone]

## 2015-06-12 ENCOUNTER — Telehealth (HOSPITAL_COMMUNITY): Payer: Self-pay | Admitting: Physical Therapy

## 2015-06-12 NOTE — Telephone Encounter (Signed)
She will get her garments on Friday and did not want to come in on Thursday

## 2015-06-13 ENCOUNTER — Encounter (HOSPITAL_COMMUNITY): Payer: Self-pay | Admitting: Physical Therapy

## 2015-06-14 NOTE — Telephone Encounter (Signed)
Called re: missed visit. Pt is not feeling well.  Stacey Miller, [place] CLT [phone]

## 2015-06-17 ENCOUNTER — Ambulatory Visit (HOSPITAL_COMMUNITY): Payer: Medicare Other | Admitting: Physical Therapy

## 2015-06-19 ENCOUNTER — Encounter (HOSPITAL_COMMUNITY): Payer: Self-pay | Admitting: Physical Therapy

## 2015-06-24 ENCOUNTER — Encounter (HOSPITAL_COMMUNITY): Payer: Medicare Other | Admitting: Physical Therapy

## 2015-06-26 ENCOUNTER — Encounter (HOSPITAL_COMMUNITY): Payer: Medicare Other | Admitting: Physical Therapy

## 2015-07-01 ENCOUNTER — Encounter (HOSPITAL_COMMUNITY): Payer: Medicare Other | Admitting: Physical Therapy

## 2015-07-03 ENCOUNTER — Encounter (HOSPITAL_COMMUNITY): Payer: Medicare Other | Admitting: Physical Therapy

## 2016-01-15 ENCOUNTER — Ambulatory Visit: Payer: Medicare Other | Admitting: Podiatry

## 2016-01-22 ENCOUNTER — Ambulatory Visit (INDEPENDENT_AMBULATORY_CARE_PROVIDER_SITE_OTHER): Payer: Medicare Other | Admitting: Podiatry

## 2016-01-22 ENCOUNTER — Telehealth: Payer: Self-pay | Admitting: *Deleted

## 2016-01-22 ENCOUNTER — Encounter: Payer: Self-pay | Admitting: Podiatry

## 2016-01-22 VITALS — BP 139/61 | HR 75 | Resp 16 | Ht 60.0 in | Wt 280.0 lb

## 2016-01-22 DIAGNOSIS — E08621 Diabetes mellitus due to underlying condition with foot ulcer: Secondary | ICD-10-CM

## 2016-01-22 DIAGNOSIS — L89891 Pressure ulcer of other site, stage 1: Secondary | ICD-10-CM

## 2016-01-22 DIAGNOSIS — L97509 Non-pressure chronic ulcer of other part of unspecified foot with unspecified severity: Principal | ICD-10-CM

## 2016-01-22 MED ORDER — CEPHALEXIN 500 MG PO CAPS: 500.0000 mg | ORAL_CAPSULE | Freq: Four times a day (QID) | ORAL | Status: DC

## 2016-01-22 NOTE — Telephone Encounter (Addendum)
Pt states the prescription antibiotic was not called to her pharmacy.  I left a message on Dr. Burnell Blanks cell phone requesting the antibiotic be called to the [facility] on [address] [phone], and pt's phone [phone].  I spoke with pt and informed her message requesting the antibiotic and pharmacy information was called to Dr. Prudence Davidson and he would either call tonight or I would call tomorrow with the information. 01/23/2016-Informed pt Dr. Prudence Davidson had called in the Cephalexin yesterday evening.  Pt states Dr. Prudence Davidson had called her as well.

## 2016-01-22 NOTE — Addendum Note (Signed)
Addended by: Ezzard Flax, Golden Gilreath L on: 01/22/2016 05:21 PM   Modules accepted: Orders

## 2016-01-22 NOTE — Progress Notes (Signed)
   Subjective:    Patient ID: Stacey Miller, female    DOB: 06-28-1951, 65 y.o.   MRN: [id number]  HPI this patient presents to the office with chief complaint of painful areas on the big toe left foot and the tip of the second toe right foot. This patient states that these areas are painful as she walks and wears her shoes. She says the big toe left foot has developed significant amount of callus noted. She also says she's having severe pain in the second toe of the right foot with an accumulation of painful callus tissue at that site. She says she has been under treatment by her medical doctor for her foot pathology. This patient is diabetic with neuropathy and cardiac heart failure. She says that she has worn bandages with Neosporin at these 2 sites. She presents the office today for an evaluation and treatment of these 2 areas    Review of Systems  Constitutional: Positive for fatigue and unexpected weight change.  Respiratory: Positive for shortness of breath.        Difficulty breathing  Endocrine: Positive for cold intolerance and heat intolerance.       Excessive thirst  Genitourinary: Positive for urgency.  Musculoskeletal: Positive for gait problem.  Neurological: Positive for numbness.  Hematological:       Swollen lymph nodes       Objective:   Physical Exam GENERAL APPEARANCE: Alert, conversant. Appropriately groomed. No acute distress.  VASCULAR: Pedal pulses are  palpable at  [facility] and PT bilateral.  Capillary refill time is immediate to all digits,  Normal temperature gradient.    NEUROLOGIC: sensation is absent to 5.07 monofilament at 5/5 sites left foot.  LOPC is diminished right foot.Sunday Corn touch is intact bilateral, Muscle strength normal.  MUSCULOSKELETAL: acceptable muscle strength, tone and stability bilateral.  Intrinsic muscluature intact bilateral.  Rectus appearance of foot and digits noted bilateral. Swelling and lack of ROM left ankle.  DERMATOLOGIC: skin color,  texture, and turgor are within normal limits.     Digital nails are asymptomatic.  Redness and swelling noted second toe right foot.  Ulcers . There is a 2 x 2 mm ulcer on left big toe. This is covered by a significant amount of necrotic tissue. There is no signs of redness, swelling, drainage or infection noted at this site. Patient has a second ulcer on the tip of the second toe right foot. This ulcer measures 10 mm x 10 mm there is necrotic tissue noted at the site of this ulcer. There is redness, swelling and pain at the proximal nail fold extending from the diabetic ulcer. This is an infected diabetic ulcer         Assessment & Plan:  Diabetic Ulcer left hallux and    diabetic ulcer second toe right foot which is infected.   TX.  IE  Debride necrotic tissue left hallux with neosporin/DSD applied.  Necrotic tissue debrided second toe distal aspect.  Neosporin/DSD.  Prescribed doxycycline.  Home soaking instructions given.  RTC 1 week.  Home bandaging instructions given.   Gardiner Barefoot DPM

## 2016-01-30 ENCOUNTER — Ambulatory Visit (INDEPENDENT_AMBULATORY_CARE_PROVIDER_SITE_OTHER): Payer: Medicare Other | Admitting: Podiatry

## 2016-01-30 DIAGNOSIS — E08621 Diabetes mellitus due to underlying condition with foot ulcer: Secondary | ICD-10-CM

## 2016-01-30 DIAGNOSIS — L89891 Pressure ulcer of other site, stage 1: Secondary | ICD-10-CM

## 2016-01-30 DIAGNOSIS — L97509 Non-pressure chronic ulcer of other part of unspecified foot with unspecified severity: Principal | ICD-10-CM

## 2016-01-30 NOTE — Progress Notes (Signed)
Subjective:    Patient ID: Stacey Miller, female    DOB: 03/10/1951, 65 y.o.   MRN: [id number]  HPI this patient presents to the office with chief complaint of painful areas on the big toe left foot and the tip of the second toe right foot. This patient states that these areas are painful as she walks and wears her shoes. She says the big toe left foot has developed significant amount of callus noted. She also says she's having severe pain in the second toe of the right foot with an accumulation of painful callus tissue at that site. She says she has been under treatment by her medical doctor for her foot pathology. This patient is diabetic with neuropathy and cardiac heart failure. She says that she has worn bandages with Neosporin at these 2 sites. She presents the office today for an evaluation and treatment of these 2 areas    Review of Systems  Constitutional: Positive for fatigue and unexpected weight change.  Respiratory: Positive for shortness of breath.        Difficulty breathing  Endocrine: Positive for cold intolerance and heat intolerance.       Excessive thirst  Genitourinary: Positive for urgency.  Musculoskeletal: Positive for gait problem.  Neurological: Positive for numbness.  Hematological:       Swollen lymph nodes       Objective:   Physical Exam GENERAL APPEARANCE: Alert, conversant. Appropriately groomed. No acute distress.  VASCULAR: Pedal pulses are  palpable at  [facility] and PT bilateral.  Capillary refill time is immediate to all digits,  Normal temperature gradient.    NEUROLOGIC: sensation is absent to 5.07 monofilament at 5/5 sites left foot.  LOPC is diminished right foot.Sunday Corn touch is intact bilateral, Muscle strength normal.  MUSCULOSKELETAL: acceptable muscle strength, tone and stability bilateral.  Intrinsic muscluature intact bilateral.  Rectus appearance of foot and digits noted bilateral. Swelling and lack of ROM left ankle.  DERMATOLOGIC: skin color,  texture, and turgor are within normal limits.     Digital nails are asymptomatic.  There is a cyst on the medial aspect left foot.  Ulcers . There is a 2 x 2 mm ulcer on left big toe. This is covered by a significant amount of necrotic tissue. There is no signs of redness, swelling, drainage or infection noted at this site. Patient has a second ulcer on the tip of the second toe right foot. This ulcer has healed well with no evidence of infection.         Assessment & Plan:  Diabetic Ulcer left hallux and    diabetic ulcer second toe right foot which improved.   TX.  Debride necrotic tissue left hallux.  Padding applied to cyst left foot.  Continue antibiotics until completed.  RTC 2 weeks.   Gardiner Barefoot DPM

## 2016-02-13 ENCOUNTER — Ambulatory Visit (INDEPENDENT_AMBULATORY_CARE_PROVIDER_SITE_OTHER): Payer: Medicare Other | Admitting: Podiatry

## 2016-02-13 ENCOUNTER — Encounter: Payer: Self-pay | Admitting: Podiatry

## 2016-02-13 VITALS — BP 138/65 | HR 76 | Resp 16

## 2016-02-13 DIAGNOSIS — L97509 Non-pressure chronic ulcer of other part of unspecified foot with unspecified severity: Secondary | ICD-10-CM | POA: Diagnosis not present

## 2016-02-13 DIAGNOSIS — E08621 Diabetes mellitus due to underlying condition with foot ulcer: Secondary | ICD-10-CM | POA: Diagnosis not present

## 2016-02-13 NOTE — Progress Notes (Signed)
Subjective:    Patient ID: Stacey Miller, female    DOB: 28-Jul-1951, 65 y.o.   MRN: [id number]  HPI this patient presents to the office with chief complaint of painful areas on the big toe left foot and the tip of the second toe right foot. This patient states that these areas are painful as she walks and wears her shoes. She says the big toe left foot has developed significant amount of callus noted. She also says she's having severe pain in the second toe of the right foot with an accumulation of painful callus tissue at that site. She says she has been under treatment by her medical doctor for her foot pathology. This patient is diabetic with neuropathy and cardiac heart failure. She says that she has worn bandaids at these two sites.. She presents the office today for an evaluation and treatment of these 2 areas    Review of Systems  Constitutional: Positive for fatigue and unexpected weight change.  Respiratory: Positive for shortness of breath.        Difficulty breathing  Endocrine: Positive for cold intolerance and heat intolerance.       Excessive thirst  Genitourinary: Positive for urgency.  Musculoskeletal: Positive for gait problem.  Neurological: Positive for numbness.  Hematological:       Swollen lymph nodes       Objective:   Physical Exam GENERAL APPEARANCE: Alert, conversant. Appropriately groomed. No acute distress.  VASCULAR: Pedal pulses are  palpable at  [facility] and PT bilateral.  Capillary refill time is immediate to all digits,  Normal temperature gradient.    NEUROLOGIC: sensation is absent to 5.07 monofilament at 5/5 sites left foot.  LOPC is diminished right foot.[place] touch is intact bilateral, Muscle strength normal.  MUSCULOSKELETAL: acceptable muscle strength, tone and stability bilateral.  Intrinsic muscluature intact bilateral.  Rectus appearance of foot and digits noted bilateral. Swelling and lack of ROM left ankle.  DERMATOLOGIC: skin color, texture, and  turgor are within normal limits.     Digital nails are asymptomatic.  There is a cyst on the medial aspect left foot.  Ulcers . There is a 2 x 2 mm ulcer on left big toe. This is covered by a significant amount of necrotic tissue. There is no signs of redness, swelling, drainage or infection noted at this site. Patient has a second ulcer on the tip of the second toe right foot. This ulcer has healed well with no evidence of infection. Callus has reformed second toe right foot.         Assessment & Plan:  Diabetic Ulcer left hallux and    diabetic ulcer second toe right foot which improved.   TX.  Debride necrotic tissue left hallux.  Padding applied to cyst left foot.  Continue antibiotics until completed.  RTC 2 weeks. Neosporin/DSD applied left hallux.  Neosporin/Bandaid second toe right foot.  Continue home soaks and bandages.   Gardiner Barefoot DPM

## 2016-02-21 ENCOUNTER — Telehealth: Payer: Self-pay | Admitting: *Deleted

## 2016-02-21 MED ORDER — CEPHALEXIN 500 MG PO CAPS: 500.0000 mg | ORAL_CAPSULE | Freq: Four times a day (QID) | ORAL | Status: AC

## 2016-02-21 NOTE — Telephone Encounter (Addendum)
Pt's caregiver, Abigail Butts states the left hallus is red, previously had infection in right 2nd toe.  I told Abigail Butts to begin warm epsom salt soaks and cover with neosporin bandaid, she said they had been doing that. I told her I would transfer Abigail Butts to schedulers for an appt.  Abigail Butts states pt has an appt on 02/27/2016 with Dr. Prudence Davidson. I told her the left 1st toe was a new problem and should be seen earlier and transferred to schedulers. Fredric Mare - scheduler states Abigail Butts would not take the Tuesday appt wanted to been seen in the [place] office.  I will inform Dr. Prudence Davidson and ask for advise.  Dr. Prudence Davidson refilled the Cephalexin as previously. Informed Abigail Butts the Cephalexin had been called to the pharmacy.

## 2016-02-27 ENCOUNTER — Encounter: Payer: Self-pay | Admitting: Podiatry

## 2016-02-27 ENCOUNTER — Ambulatory Visit (INDEPENDENT_AMBULATORY_CARE_PROVIDER_SITE_OTHER): Payer: Medicare Other | Admitting: Podiatry

## 2016-02-27 VITALS — BP 140/53 | HR 71 | Resp 16

## 2016-02-27 DIAGNOSIS — L89891 Pressure ulcer of other site, stage 1: Secondary | ICD-10-CM

## 2016-02-27 DIAGNOSIS — E08621 Diabetes mellitus due to underlying condition with foot ulcer: Secondary | ICD-10-CM

## 2016-02-27 DIAGNOSIS — L97509 Non-pressure chronic ulcer of other part of unspecified foot with unspecified severity: Principal | ICD-10-CM

## 2016-02-27 NOTE — Progress Notes (Signed)
Subjective:     Patient ID: Stacey Miller, female   DOB: 1951/06/13, 65 y.o.   MRN: [id number]  HPI this patient returns to the office follow-up for diabetic ulcers on the tip of the second toe right and her big toe left foot. She states that both of these sites aren't now giving her a fifth says that the big toe left foot became red and infected and she called in for a prescription which she was given. She was called and cephalexin to take 1 4 times a day she presents the office today for an evaluation and treatment of these 2 areas   Review of Systems     Objective:   Physical Exam GENERAL APPEARANCE: Alert, conversant. Appropriately groomed. No acute distress.  VASCULAR: Pedal pulses are  palpable at  [facility] and PT bilateral.  Capillary refill time is immediate to all digits,  Normal temperature gradient.  Digital hair growth is present bilateral  NEUROLOGIC: sensation is absent to 5.07 monofilament at 5/5 sites bilateral.  Light touch is intact bilateral, Muscle strength normal.  MUSCULOSKELETAL: acceptable muscle strength, tone and stability bilateral.  Intrinsic muscluature intact bilateral.  Rectus appearance of foot and digits noted bilateral.   DERMATOLOGIC: Her second toe right foot has a close ulcer on the tip of the second toe right. There is a hematoma/blood blister noted on the lateral aspect of the second toe, which is very painful to the touch. No evidence of any redness, swelling or infection. Examination of her left hallux does reveal significant callus noted medially. This ulcer appears to be healing except for a measured 3 mm x 8 mm area on the lateral aspect of the ulcer left hallux. No evidence of any redness, swelling or infection      Assessment:     Diabetic ulcer B/L    Plan:     ROV  Debride necrotic tissue.  Neosporin/DSD.  Continue taking antibiotics until completed dispensed a surgical shoe to be worn on her left foot to help promote healing of the ulcer on her left  hallux. Continue home soaks and bandaging. Return to the office in 2 weeks for further evaluation and treatment   Gardiner Barefoot DPM

## 2016-03-11 ENCOUNTER — Ambulatory Visit: Payer: Medicare Other | Admitting: Podiatry

## 2016-03-26 ENCOUNTER — Encounter: Payer: Self-pay | Admitting: Podiatry

## 2016-03-26 ENCOUNTER — Ambulatory Visit (INDEPENDENT_AMBULATORY_CARE_PROVIDER_SITE_OTHER): Payer: Medicare Other | Admitting: Podiatry

## 2016-03-26 DIAGNOSIS — L97509 Non-pressure chronic ulcer of other part of unspecified foot with unspecified severity: Principal | ICD-10-CM

## 2016-03-26 DIAGNOSIS — E08621 Diabetes mellitus due to underlying condition with foot ulcer: Secondary | ICD-10-CM

## 2016-03-26 DIAGNOSIS — L89891 Pressure ulcer of other site, stage 1: Secondary | ICD-10-CM | POA: Diagnosis not present

## 2016-03-26 NOTE — Progress Notes (Signed)
Subjective:     Patient ID: Stacey Miller, female   DOB: 11/23/1950, 65 y.o.   MRN: [id number]  HPI this patient returns to the office follow-up for diabetic ulcers on the tip of the second toe right foot and her big toe left foot. She states that she has taken her antibiotics for the infection in the big toe left foot. She states that she still is feeling continued pain and discomfort at the site of the ulcers. She says that there is still drainage coming from the great toe left and the second toe right. She presents the office today for continued evaluation and treatment of this condition   Review of Systems     Objective:   Physical Exam GENERAL APPEARANCE: Alert, conversant. Appropriately groomed. No acute distress.  VASCULAR: Pedal pulses are  palpable at  [facility] and PT bilateral.  Capillary refill time is immediate to all digits,  Normal temperature gradient.   NEUROLOGIC: sensation is absent  to 5.07 monofilament at 5/5 sites bilateral.  Light touch is intact bilateral, Muscle strength normal.  MUSCULOSKELETAL: acceptable muscle strength, tone and stability bilateral.  Intrinsic muscluature intact bilateral.  Rectus appearance of foot and digits noted bilateral.   DERMATOLOGIC: Her second toe of the right feel has excessive necrotic tissue noted on the lateral aspect of the tip of the second toe right. There is mild redness to the toe but no evidence of any drainage noted at this time. The toe continues to be very painful to the touch. Examination of her left hallux does reveal callus has developed medially. The ulcer on the plantar aspect of the left great toe reveals a blackened eschar noted over the ulcer. No evidence of any redness, swelling or infection. This ulcer is also very painful to the patient      Assessment:     Diabetic ulcer B/L    Plan:  ROV  Debride necrotic tissue both ulcers B/L  Neosporin/DSD.  Continue home soaks.  Plan to sechedule her an appointment at the wound  center per [facility].  RTC prn.   Gardiner Barefoot DPM

## 2016-03-27 ENCOUNTER — Telehealth: Payer: Self-pay | Admitting: *Deleted

## 2016-03-27 DIAGNOSIS — L97509 Non-pressure chronic ulcer of other part of unspecified foot with unspecified severity: Principal | ICD-10-CM

## 2016-03-27 DIAGNOSIS — E08621 Diabetes mellitus due to underlying condition with foot ulcer: Secondary | ICD-10-CM

## 2016-03-27 NOTE — Telephone Encounter (Addendum)
Dr. Prudence Davidson referred pt to [facility]. Faxed required referral form, clinicals and demographics. 04/13/2016-pt states someone continues to call about a medication and she is now being seen by [facility]. 04/14/2016-I spoke with pt and she said it was a medication changed by [place] before she picked up the doxycycline [facility] had ordered.

## 2016-04-09 ENCOUNTER — Encounter (HOSPITAL_BASED_OUTPATIENT_CLINIC_OR_DEPARTMENT_OTHER): Payer: Medicare Other | Attending: Internal Medicine

## 2016-04-09 DIAGNOSIS — L84 Corns and callosities: Secondary | ICD-10-CM | POA: Insufficient documentation

## 2016-04-09 DIAGNOSIS — I272 Other secondary pulmonary hypertension: Secondary | ICD-10-CM | POA: Diagnosis not present

## 2016-04-09 DIAGNOSIS — H409 Unspecified glaucoma: Secondary | ICD-10-CM | POA: Insufficient documentation

## 2016-04-09 DIAGNOSIS — I509 Heart failure, unspecified: Secondary | ICD-10-CM | POA: Insufficient documentation

## 2016-04-09 DIAGNOSIS — E1140 Type 2 diabetes mellitus with diabetic neuropathy, unspecified: Secondary | ICD-10-CM | POA: Insufficient documentation

## 2016-04-09 DIAGNOSIS — E11621 Type 2 diabetes mellitus with foot ulcer: Secondary | ICD-10-CM | POA: Diagnosis present

## 2016-04-09 DIAGNOSIS — G4730 Sleep apnea, unspecified: Secondary | ICD-10-CM | POA: Insufficient documentation

## 2016-04-09 DIAGNOSIS — E1151 Type 2 diabetes mellitus with diabetic peripheral angiopathy without gangrene: Secondary | ICD-10-CM | POA: Diagnosis not present

## 2016-04-09 DIAGNOSIS — J449 Chronic obstructive pulmonary disease, unspecified: Secondary | ICD-10-CM | POA: Insufficient documentation

## 2016-04-09 DIAGNOSIS — Z9981 Dependence on supplemental oxygen: Secondary | ICD-10-CM | POA: Insufficient documentation

## 2016-04-09 DIAGNOSIS — I110 Hypertensive heart disease with heart failure: Secondary | ICD-10-CM | POA: Diagnosis not present

## 2016-04-09 DIAGNOSIS — L97511 Non-pressure chronic ulcer of other part of right foot limited to breakdown of skin: Secondary | ICD-10-CM | POA: Diagnosis not present

## 2016-04-09 DIAGNOSIS — L97521 Non-pressure chronic ulcer of other part of left foot limited to breakdown of skin: Secondary | ICD-10-CM | POA: Insufficient documentation

## 2016-04-13 ENCOUNTER — Other Ambulatory Visit: Payer: Self-pay | Admitting: Internal Medicine

## 2016-04-13 ENCOUNTER — Ambulatory Visit (HOSPITAL_COMMUNITY)
Admission: RE | Admit: 2016-04-13 | Discharge: 2016-04-13 | Disposition: A | Discharge status: Home / Self Care | Payer: Medicare Other | Source: Ambulatory Visit | Attending: Internal Medicine | Admitting: Internal Medicine

## 2016-04-13 DIAGNOSIS — M19071 Primary osteoarthritis, right ankle and foot: Secondary | ICD-10-CM | POA: Insufficient documentation

## 2016-04-13 DIAGNOSIS — M19072 Primary osteoarthritis, left ankle and foot: Secondary | ICD-10-CM | POA: Diagnosis not present

## 2016-04-13 DIAGNOSIS — M869 Osteomyelitis, unspecified: Secondary | ICD-10-CM

## 2016-04-13 DIAGNOSIS — L97512 Non-pressure chronic ulcer of other part of right foot with fat layer exposed: Secondary | ICD-10-CM | POA: Insufficient documentation

## 2016-04-13 DIAGNOSIS — I70209 Unspecified atherosclerosis of native arteries of extremities, unspecified extremity: Secondary | ICD-10-CM | POA: Diagnosis not present

## 2016-04-13 DIAGNOSIS — M8580 Other specified disorders of bone density and structure, unspecified site: Secondary | ICD-10-CM | POA: Diagnosis not present

## 2016-04-13 DIAGNOSIS — E11621 Type 2 diabetes mellitus with foot ulcer: Secondary | ICD-10-CM | POA: Diagnosis present

## 2016-04-13 DIAGNOSIS — L97511 Non-pressure chronic ulcer of other part of right foot limited to breakdown of skin: Secondary | ICD-10-CM | POA: Insufficient documentation

## 2016-04-16 DIAGNOSIS — E11621 Type 2 diabetes mellitus with foot ulcer: Secondary | ICD-10-CM | POA: Diagnosis not present

## 2016-04-17 ENCOUNTER — Other Ambulatory Visit (HOSPITAL_BASED_OUTPATIENT_CLINIC_OR_DEPARTMENT_OTHER): Payer: Self-pay | Admitting: General Surgery

## 2016-04-17 DIAGNOSIS — E11621 Type 2 diabetes mellitus with foot ulcer: Secondary | ICD-10-CM

## 2016-04-17 DIAGNOSIS — L97519 Non-pressure chronic ulcer of other part of right foot with unspecified severity: Principal | ICD-10-CM

## 2016-04-23 DIAGNOSIS — E11621 Type 2 diabetes mellitus with foot ulcer: Secondary | ICD-10-CM | POA: Diagnosis not present

## 2016-04-27 ENCOUNTER — Ambulatory Visit (HOSPITAL_COMMUNITY)
Admission: RE | Admit: 2016-04-27 | Discharge: 2016-04-27 | Disposition: A | Discharge status: Home / Self Care | Payer: Medicare Other | Source: Ambulatory Visit | Attending: General Surgery | Admitting: General Surgery

## 2016-04-27 DIAGNOSIS — L97519 Non-pressure chronic ulcer of other part of right foot with unspecified severity: Secondary | ICD-10-CM

## 2016-04-27 DIAGNOSIS — L97511 Non-pressure chronic ulcer of other part of right foot limited to breakdown of skin: Secondary | ICD-10-CM | POA: Diagnosis not present

## 2016-04-27 DIAGNOSIS — E11621 Type 2 diabetes mellitus with foot ulcer: Secondary | ICD-10-CM | POA: Insufficient documentation

## 2016-04-27 LAB — POCT I-STAT CREATININE: Creatinine, Ser: 1 mg/dL (ref 0.44–1.00)

## 2016-04-27 MED ORDER — GADOBENATE DIMEGLUMINE 529 MG/ML IV SOLN
20.0000 mL | Freq: Once | INTRAVENOUS | Status: AC | PRN
  Administered 2016-04-27: 20 mL via INTRAVENOUS

## 2016-04-30 DIAGNOSIS — E11621 Type 2 diabetes mellitus with foot ulcer: Secondary | ICD-10-CM | POA: Diagnosis not present

## 2016-05-07 DIAGNOSIS — E11621 Type 2 diabetes mellitus with foot ulcer: Secondary | ICD-10-CM | POA: Diagnosis not present

## 2016-05-14 ENCOUNTER — Encounter (HOSPITAL_BASED_OUTPATIENT_CLINIC_OR_DEPARTMENT_OTHER): Payer: Medicare Other | Attending: Internal Medicine

## 2016-05-14 DIAGNOSIS — I509 Heart failure, unspecified: Secondary | ICD-10-CM | POA: Insufficient documentation

## 2016-05-14 DIAGNOSIS — I110 Hypertensive heart disease with heart failure: Secondary | ICD-10-CM | POA: Insufficient documentation

## 2016-05-14 DIAGNOSIS — I890 Lymphedema, not elsewhere classified: Secondary | ICD-10-CM | POA: Insufficient documentation

## 2016-05-14 DIAGNOSIS — H409 Unspecified glaucoma: Secondary | ICD-10-CM | POA: Insufficient documentation

## 2016-05-14 DIAGNOSIS — L97511 Non-pressure chronic ulcer of other part of right foot limited to breakdown of skin: Secondary | ICD-10-CM | POA: Insufficient documentation

## 2016-05-14 DIAGNOSIS — L97521 Non-pressure chronic ulcer of other part of left foot limited to breakdown of skin: Secondary | ICD-10-CM | POA: Insufficient documentation

## 2016-05-14 DIAGNOSIS — E11621 Type 2 diabetes mellitus with foot ulcer: Secondary | ICD-10-CM | POA: Insufficient documentation

## 2016-05-14 DIAGNOSIS — G4730 Sleep apnea, unspecified: Secondary | ICD-10-CM | POA: Insufficient documentation

## 2016-05-14 DIAGNOSIS — E1151 Type 2 diabetes mellitus with diabetic peripheral angiopathy without gangrene: Secondary | ICD-10-CM | POA: Insufficient documentation

## 2016-05-14 DIAGNOSIS — J449 Chronic obstructive pulmonary disease, unspecified: Secondary | ICD-10-CM | POA: Insufficient documentation

## 2016-05-14 DIAGNOSIS — E1140 Type 2 diabetes mellitus with diabetic neuropathy, unspecified: Secondary | ICD-10-CM | POA: Insufficient documentation

## 2016-05-21 DIAGNOSIS — J449 Chronic obstructive pulmonary disease, unspecified: Secondary | ICD-10-CM | POA: Diagnosis not present

## 2016-05-21 DIAGNOSIS — E1151 Type 2 diabetes mellitus with diabetic peripheral angiopathy without gangrene: Secondary | ICD-10-CM | POA: Diagnosis not present

## 2016-05-21 DIAGNOSIS — E11621 Type 2 diabetes mellitus with foot ulcer: Secondary | ICD-10-CM | POA: Diagnosis present

## 2016-05-21 DIAGNOSIS — E1140 Type 2 diabetes mellitus with diabetic neuropathy, unspecified: Secondary | ICD-10-CM | POA: Diagnosis not present

## 2016-05-21 DIAGNOSIS — G4730 Sleep apnea, unspecified: Secondary | ICD-10-CM | POA: Diagnosis not present

## 2016-05-21 DIAGNOSIS — H409 Unspecified glaucoma: Secondary | ICD-10-CM | POA: Diagnosis not present

## 2016-05-21 DIAGNOSIS — I509 Heart failure, unspecified: Secondary | ICD-10-CM | POA: Diagnosis not present

## 2016-05-21 DIAGNOSIS — I110 Hypertensive heart disease with heart failure: Secondary | ICD-10-CM | POA: Diagnosis not present

## 2016-05-21 DIAGNOSIS — I890 Lymphedema, not elsewhere classified: Secondary | ICD-10-CM | POA: Diagnosis not present

## 2016-05-21 DIAGNOSIS — L97521 Non-pressure chronic ulcer of other part of left foot limited to breakdown of skin: Secondary | ICD-10-CM | POA: Diagnosis not present

## 2016-05-21 DIAGNOSIS — L97511 Non-pressure chronic ulcer of other part of right foot limited to breakdown of skin: Secondary | ICD-10-CM | POA: Diagnosis not present

## 2016-05-28 DIAGNOSIS — E11621 Type 2 diabetes mellitus with foot ulcer: Secondary | ICD-10-CM | POA: Diagnosis not present

## 2016-06-04 DIAGNOSIS — E11621 Type 2 diabetes mellitus with foot ulcer: Secondary | ICD-10-CM | POA: Diagnosis not present

## 2016-06-11 ENCOUNTER — Encounter (HOSPITAL_BASED_OUTPATIENT_CLINIC_OR_DEPARTMENT_OTHER): Payer: Medicare Other | Attending: Internal Medicine

## 2016-06-11 DIAGNOSIS — L97511 Non-pressure chronic ulcer of other part of right foot limited to breakdown of skin: Secondary | ICD-10-CM | POA: Insufficient documentation

## 2016-06-11 DIAGNOSIS — L97521 Non-pressure chronic ulcer of other part of left foot limited to breakdown of skin: Secondary | ICD-10-CM | POA: Diagnosis not present

## 2016-06-11 DIAGNOSIS — G4730 Sleep apnea, unspecified: Secondary | ICD-10-CM | POA: Insufficient documentation

## 2016-06-11 DIAGNOSIS — E1151 Type 2 diabetes mellitus with diabetic peripheral angiopathy without gangrene: Secondary | ICD-10-CM | POA: Diagnosis not present

## 2016-06-11 DIAGNOSIS — I890 Lymphedema, not elsewhere classified: Secondary | ICD-10-CM | POA: Insufficient documentation

## 2016-06-11 DIAGNOSIS — H409 Unspecified glaucoma: Secondary | ICD-10-CM | POA: Insufficient documentation

## 2016-06-11 DIAGNOSIS — E11621 Type 2 diabetes mellitus with foot ulcer: Secondary | ICD-10-CM | POA: Diagnosis not present

## 2016-06-11 DIAGNOSIS — I110 Hypertensive heart disease with heart failure: Secondary | ICD-10-CM | POA: Diagnosis not present

## 2016-06-11 DIAGNOSIS — E1140 Type 2 diabetes mellitus with diabetic neuropathy, unspecified: Secondary | ICD-10-CM | POA: Insufficient documentation

## 2016-06-11 DIAGNOSIS — J449 Chronic obstructive pulmonary disease, unspecified: Secondary | ICD-10-CM | POA: Diagnosis not present

## 2016-06-11 DIAGNOSIS — I509 Heart failure, unspecified: Secondary | ICD-10-CM | POA: Diagnosis not present

## 2016-06-18 DIAGNOSIS — E11621 Type 2 diabetes mellitus with foot ulcer: Secondary | ICD-10-CM | POA: Diagnosis not present

## 2016-06-25 DIAGNOSIS — E11621 Type 2 diabetes mellitus with foot ulcer: Secondary | ICD-10-CM | POA: Diagnosis not present

## 2016-07-03 DIAGNOSIS — E11621 Type 2 diabetes mellitus with foot ulcer: Secondary | ICD-10-CM | POA: Diagnosis not present

## 2016-07-09 ENCOUNTER — Encounter (HOSPITAL_BASED_OUTPATIENT_CLINIC_OR_DEPARTMENT_OTHER): Payer: Medicare Other | Attending: Internal Medicine

## 2016-07-09 DIAGNOSIS — J449 Chronic obstructive pulmonary disease, unspecified: Secondary | ICD-10-CM | POA: Insufficient documentation

## 2016-07-09 DIAGNOSIS — I110 Hypertensive heart disease with heart failure: Secondary | ICD-10-CM | POA: Diagnosis not present

## 2016-07-09 DIAGNOSIS — E11621 Type 2 diabetes mellitus with foot ulcer: Secondary | ICD-10-CM | POA: Diagnosis present

## 2016-07-09 DIAGNOSIS — G4733 Obstructive sleep apnea (adult) (pediatric): Secondary | ICD-10-CM | POA: Diagnosis not present

## 2016-07-09 DIAGNOSIS — E1140 Type 2 diabetes mellitus with diabetic neuropathy, unspecified: Secondary | ICD-10-CM | POA: Diagnosis not present

## 2016-07-09 DIAGNOSIS — D649 Anemia, unspecified: Secondary | ICD-10-CM | POA: Diagnosis not present

## 2016-07-09 DIAGNOSIS — L97521 Non-pressure chronic ulcer of other part of left foot limited to breakdown of skin: Secondary | ICD-10-CM | POA: Diagnosis not present

## 2016-07-09 DIAGNOSIS — Z9981 Dependence on supplemental oxygen: Secondary | ICD-10-CM | POA: Insufficient documentation

## 2016-07-09 DIAGNOSIS — I509 Heart failure, unspecified: Secondary | ICD-10-CM | POA: Diagnosis not present

## 2016-07-09 DIAGNOSIS — L97511 Non-pressure chronic ulcer of other part of right foot limited to breakdown of skin: Secondary | ICD-10-CM | POA: Insufficient documentation

## 2016-07-09 DIAGNOSIS — I890 Lymphedema, not elsewhere classified: Secondary | ICD-10-CM | POA: Diagnosis not present

## 2016-07-16 DIAGNOSIS — E11621 Type 2 diabetes mellitus with foot ulcer: Secondary | ICD-10-CM | POA: Diagnosis not present

## 2016-07-23 DIAGNOSIS — E11621 Type 2 diabetes mellitus with foot ulcer: Secondary | ICD-10-CM | POA: Diagnosis not present

## 2016-08-06 DIAGNOSIS — E11621 Type 2 diabetes mellitus with foot ulcer: Secondary | ICD-10-CM | POA: Diagnosis not present

## 2016-08-20 ENCOUNTER — Encounter (HOSPITAL_BASED_OUTPATIENT_CLINIC_OR_DEPARTMENT_OTHER): Payer: Medicare Other | Attending: Internal Medicine

## 2016-08-20 DIAGNOSIS — E11621 Type 2 diabetes mellitus with foot ulcer: Secondary | ICD-10-CM | POA: Insufficient documentation

## 2016-08-20 DIAGNOSIS — E1140 Type 2 diabetes mellitus with diabetic neuropathy, unspecified: Secondary | ICD-10-CM | POA: Insufficient documentation

## 2016-08-20 DIAGNOSIS — I509 Heart failure, unspecified: Secondary | ICD-10-CM | POA: Diagnosis not present

## 2016-08-20 DIAGNOSIS — I110 Hypertensive heart disease with heart failure: Secondary | ICD-10-CM | POA: Diagnosis not present

## 2016-08-20 DIAGNOSIS — E1151 Type 2 diabetes mellitus with diabetic peripheral angiopathy without gangrene: Secondary | ICD-10-CM | POA: Diagnosis not present

## 2016-08-20 DIAGNOSIS — Z79899 Other long term (current) drug therapy: Secondary | ICD-10-CM | POA: Insufficient documentation

## 2016-08-20 DIAGNOSIS — L97511 Non-pressure chronic ulcer of other part of right foot limited to breakdown of skin: Secondary | ICD-10-CM | POA: Insufficient documentation

## 2016-08-20 DIAGNOSIS — G4730 Sleep apnea, unspecified: Secondary | ICD-10-CM | POA: Diagnosis not present

## 2016-08-20 DIAGNOSIS — J449 Chronic obstructive pulmonary disease, unspecified: Secondary | ICD-10-CM | POA: Insufficient documentation

## 2016-09-03 DIAGNOSIS — E11621 Type 2 diabetes mellitus with foot ulcer: Secondary | ICD-10-CM | POA: Diagnosis not present

## 2016-09-17 ENCOUNTER — Encounter (HOSPITAL_BASED_OUTPATIENT_CLINIC_OR_DEPARTMENT_OTHER): Payer: Medicare Other | Attending: Internal Medicine

## 2016-09-17 DIAGNOSIS — E1140 Type 2 diabetes mellitus with diabetic neuropathy, unspecified: Secondary | ICD-10-CM | POA: Diagnosis not present

## 2016-09-17 DIAGNOSIS — I2723 Pulmonary hypertension due to lung diseases and hypoxia: Secondary | ICD-10-CM | POA: Diagnosis not present

## 2016-09-17 DIAGNOSIS — Z9981 Dependence on supplemental oxygen: Secondary | ICD-10-CM | POA: Insufficient documentation

## 2016-09-17 DIAGNOSIS — E11621 Type 2 diabetes mellitus with foot ulcer: Secondary | ICD-10-CM | POA: Diagnosis present

## 2016-09-17 DIAGNOSIS — J449 Chronic obstructive pulmonary disease, unspecified: Secondary | ICD-10-CM | POA: Insufficient documentation

## 2016-09-17 DIAGNOSIS — L97521 Non-pressure chronic ulcer of other part of left foot limited to breakdown of skin: Secondary | ICD-10-CM | POA: Insufficient documentation

## 2016-09-17 DIAGNOSIS — L97511 Non-pressure chronic ulcer of other part of right foot limited to breakdown of skin: Secondary | ICD-10-CM | POA: Diagnosis not present

## 2016-09-17 DIAGNOSIS — L84 Corns and callosities: Secondary | ICD-10-CM | POA: Diagnosis not present

## 2016-10-29 ENCOUNTER — Encounter (HOSPITAL_BASED_OUTPATIENT_CLINIC_OR_DEPARTMENT_OTHER): Payer: Medicare Other | Attending: Internal Medicine

## 2016-10-29 DIAGNOSIS — E119 Type 2 diabetes mellitus without complications: Secondary | ICD-10-CM | POA: Diagnosis not present

## 2016-10-29 DIAGNOSIS — L84 Corns and callosities: Secondary | ICD-10-CM | POA: Insufficient documentation

## 2016-10-29 DIAGNOSIS — Z7984 Long term (current) use of oral hypoglycemic drugs: Secondary | ICD-10-CM | POA: Insufficient documentation

## 2016-10-29 DIAGNOSIS — L89899 Pressure ulcer of other site, unspecified stage: Secondary | ICD-10-CM | POA: Diagnosis present

## 2016-11-05 ENCOUNTER — Encounter (HOSPITAL_BASED_OUTPATIENT_CLINIC_OR_DEPARTMENT_OTHER): Payer: Medicare Other | Attending: Internal Medicine

## 2016-11-05 DIAGNOSIS — L84 Corns and callosities: Secondary | ICD-10-CM | POA: Diagnosis not present

## 2016-11-05 DIAGNOSIS — Z09 Encounter for follow-up examination after completed treatment for conditions other than malignant neoplasm: Secondary | ICD-10-CM | POA: Insufficient documentation

## 2016-11-05 DIAGNOSIS — Z8631 Personal history of diabetic foot ulcer: Secondary | ICD-10-CM | POA: Diagnosis not present

## 2016-12-16 ENCOUNTER — Encounter: Payer: Self-pay | Admitting: Podiatry

## 2016-12-16 ENCOUNTER — Ambulatory Visit (INDEPENDENT_AMBULATORY_CARE_PROVIDER_SITE_OTHER): Payer: Medicare Other | Admitting: Podiatry

## 2016-12-16 VITALS — BP 161/64 | HR 78

## 2016-12-16 DIAGNOSIS — L97521 Non-pressure chronic ulcer of other part of left foot limited to breakdown of skin: Secondary | ICD-10-CM | POA: Diagnosis not present

## 2016-12-16 DIAGNOSIS — E08621 Diabetes mellitus due to underlying condition with foot ulcer: Secondary | ICD-10-CM

## 2016-12-16 NOTE — Progress Notes (Signed)
Subjective:     Patient ID: Stacey Miller, female   DOB: [birth year], 66 y.o.   MRN: [id number]  HPI this patient returns to the office follow-up for diabetic ulcers on the tip of the second toe right foot and her big toe left foot. She was told to go to the wound Center for treatment, which she did.  She says on Labor Day she developed a MRSA infection in her right foot and was hospitalized. She says that her right and her left foot ulcers have closed after the hospitalization.  She now presents the office stating that she has developed a significantly large and painful callus on the big toe left foot at the site of the previous ulcer. She also says she has a callous on the tip of the second toe which is the site of the of another previous ulcer. She denies any drainage coming from either skin lesion. She presents the office today for an evaluation and treatment of both her feet   Review of Systems     Objective:   Physical Exam GENERAL APPEARANCE: Alert, conversant. Appropriately groomed. No acute distress.  VASCULAR: Pedal pulses are  palpable at  [facility] and PT bilateral.  Capillary refill time is immediate to all digits,  Normal temperature gradient.   NEUROLOGIC: sensation is absent  to 5.07 monofilament at 5/5 sites bilateral.  Light touch is intact bilateral, Muscle strength normal.  MUSCULOSKELETAL: acceptable muscle strength, tone and stability bilateral.  Intrinsic muscluature intact bilateral.  Rectus appearance of foot and digits noted bilateral.   DERMATOLOGIC: Her second toe of the right Foot has developed a callus at the site of the previous ulcer. No evidence of any redness, swelling or drainage noted. Examination of the left hallux does reveal significantly thickened and enlarged callus noted at the  plantar medial aspect of the left foot.  No redness, swelling or drainage noted      Assessment:     Diabetic ulcer B/L    Plan:  ROV  Debridement of Both pre-ulcerous areas evidence of  any pus or drainage noted. Discussed this condition with this patient and told her that she needs to return to the office every 4 weeks for debridement of thickened pre-ulcerous tissue to prevent future breakdown   Gardiner Barefoot DPM

## 2017-01-13 ENCOUNTER — Ambulatory Visit (INDEPENDENT_AMBULATORY_CARE_PROVIDER_SITE_OTHER): Payer: Medicare Other | Admitting: Podiatry

## 2017-01-13 DIAGNOSIS — L97521 Non-pressure chronic ulcer of other part of left foot limited to breakdown of skin: Secondary | ICD-10-CM | POA: Diagnosis not present

## 2017-01-13 DIAGNOSIS — E08621 Diabetes mellitus due to underlying condition with foot ulcer: Secondary | ICD-10-CM | POA: Diagnosis not present

## 2017-01-13 NOTE — Progress Notes (Signed)
Subjective:     Patient ID: Stacey Miller, female   DOB: [birth year], 66 y.o.   MRN: [id number]  HPI this patient returns to the office follow-up for diabetic ulcers on the tip of the second toe right foot and her big toe left foot.  Patient says the second toe has healed completely.  She says her ulcer/callus left hallux has been draining and hurting the last week.  She presents to the office for continued evaluation of her diabetic ulcer.   Review of Systems     Objective:   Physical Exam GENERAL APPEARANCE: Alert, conversant. Appropriately groomed. No acute distress.  VASCULAR: Pedal pulses are  palpable at  [facility] and PT bilateral.  Capillary refill time is immediate to all digits,  Normal temperature gradient.   NEUROLOGIC: sensation is absent  to 5.07 monofilament at 5/5 sites bilateral.  Light touch is intact bilateral, Muscle strength normal.  MUSCULOSKELETAL: acceptable muscle strength, tone and stability bilateral.  Intrinsic muscluature intact bilateral.  Rectus appearance of foot and digits noted bilateral.   DERMATOLOGIC:  Examination of the left hallux does reveal significantly thickened and enlarged callus noted at the  plantar medial aspect of the great toe left foot..  No redness, swelling or drainage noted.  There is a 5 mm. X 5 mm ulcer on the plantat edge of hemorrhagic crust.  No infection noted.  Neosporin/DSD.  To soak and bandage following home instructions.     Assessment:     Diabetic ulcer B/L    Plan:  ROV  Debridement of diabetic ulcer left hallux. Discussed this condition with this patient and told her that she needs to return to the office every 3 weeks for debridement of thickened pre-ulcerous tissue to prevent future breakdown   Gardiner Barefoot DPM

## 2017-02-03 ENCOUNTER — Ambulatory Visit (INDEPENDENT_AMBULATORY_CARE_PROVIDER_SITE_OTHER): Payer: Medicare Other

## 2017-02-03 ENCOUNTER — Encounter: Payer: Self-pay | Admitting: Podiatry

## 2017-02-03 ENCOUNTER — Ambulatory Visit (INDEPENDENT_AMBULATORY_CARE_PROVIDER_SITE_OTHER): Payer: Medicare Other | Admitting: Podiatry

## 2017-02-03 VITALS — BP 136/61 | HR 77

## 2017-02-03 DIAGNOSIS — L97521 Non-pressure chronic ulcer of other part of left foot limited to breakdown of skin: Secondary | ICD-10-CM | POA: Diagnosis not present

## 2017-02-03 DIAGNOSIS — R52 Pain, unspecified: Secondary | ICD-10-CM

## 2017-02-03 DIAGNOSIS — E08621 Diabetes mellitus due to underlying condition with foot ulcer: Secondary | ICD-10-CM

## 2017-02-03 NOTE — Progress Notes (Signed)
Subjective:     Patient ID: Stacey Miller, female   DOB: 07-Sep-1951, 66 y.o.   MRN: [id number]  HPI this patient returns to the office follow-up for diabetic ulcers on the tip of the second toe right foot and her big toe left foot.  The ulcer second toe right foot has healed with no pain noted.   She says her ulcer/callus left hallux has been draining and hurting the last week.  She presents to the office for continued evaluation of her diabetic ulcer.   Review of Systems     Objective:   Physical Exam GENERAL APPEARANCE: Alert, conversant. Appropriately groomed. No acute distress.  VASCULAR: Pedal pulses are  palpable at  [facility] and PT bilateral.  Capillary refill time is immediate to all digits,  Normal temperature gradient.   NEUROLOGIC: sensation is absent  to 5.07 monofilament at 5/5 sites bilateral.  Light touch is intact bilateral, Muscle strength normal.  MUSCULOSKELETAL: acceptable muscle strength, tone and stability bilateral.  Intrinsic muscluature intact bilateral.  Rectus appearance of foot and digits noted bilateral.   DERMATOLOGIC:  Examination of the left hallux does reveal significantly thickened and enlarged callus noted at the  plantar medial aspect of the great toe left foot..  No redness, swelling or drainage noted.  There is a 5 mm. X 5 mm ulcer on the plantat edge of hemorrhagic crust.  No infection noted.  Neosporin/DSD.  To soak and bandage following home instructions.     Assessment:     Diabetic ulcer B/L    Plan:  ROV  Debridement of diabetic ulcer left hallux. Discussed this condition with this patient and told her that she needs to return to the office every 3 weeks for debridement of thickened pre-ulcerous tissue to prevent future breakdown.  Since this ulcer has broken down before her scheduled appointment. I am concerned about the ulcer.  X-rays were taken to rule out osteomyelitis. No evidence of any bone infection or or cysts from an infection is noted. There is  significant osteopenia. I also asked one of the doctors to evaluate her diabetic ulcer and off for an opinion on further treatment. I am concerned that her gait is causing this ulcer to continually re-opened after we close the ulcer. Consult with Ruby Cola and Liliane Channel was performed.  We are to order special dispersion padding for left foot then F/U with diabetic shoes.  RTC 1 week for evaluation and pick up special off weight bearing shoes.   Gardiner Barefoot DPM

## 2017-02-10 ENCOUNTER — Ambulatory Visit: Payer: Medicare Other | Admitting: Podiatry

## 2017-02-17 ENCOUNTER — Ambulatory Visit: Payer: Medicare Other | Admitting: Podiatry

## 2017-02-24 ENCOUNTER — Ambulatory Visit (INDEPENDENT_AMBULATORY_CARE_PROVIDER_SITE_OTHER): Payer: Medicare Other | Admitting: Podiatry

## 2017-02-24 DIAGNOSIS — I70245 Atherosclerosis of native arteries of left leg with ulceration of other part of foot: Secondary | ICD-10-CM

## 2017-02-24 DIAGNOSIS — L97522 Non-pressure chronic ulcer of other part of left foot with fat layer exposed: Secondary | ICD-10-CM

## 2017-02-24 DIAGNOSIS — E0842 Diabetes mellitus due to underlying condition with diabetic polyneuropathy: Secondary | ICD-10-CM

## 2017-02-24 MED ORDER — GENTAMICIN SULFATE 0.1 % EX CREA: 1.0000 "application " | TOPICAL_CREAM | Freq: Three times a day (TID) | CUTANEOUS | 1 refills | Status: AC

## 2017-02-24 MED ORDER — GENTAMICIN SULFATE 0.1 % EX CREA: 1.0000 "application " | TOPICAL_CREAM | Freq: Three times a day (TID) | CUTANEOUS | 0 refills | Status: DC

## 2017-02-28 NOTE — Progress Notes (Signed)
   Subjective:  Patient with a history of diabetes mellitus presents today for evaluation of an ulcer to the left big toe. He has not done anything to treat area. He is here for further evaluation and treatment.  Objective/Physical Exam General: The patient is alert and oriented x3 in no acute distress.  Dermatology:  Wound #1 noted to the left great toe measuring 1.0  1.3  0.1 cm (LxWxD).   To the noted ulceration(s), there is no eschar. There is a moderate amount of slough, fibrin, and necrotic tissue noted. Granulation tissue and wound base is red. There is a minimal amount of serosanguineous drainage noted. There is no exposed bone muscle-tendon ligament or joint. There is no malodor. Periwound integrity is intact. Skin is warm, dry and supple bilateral lower extremities.  Vascular: Palpable pedal pulses bilaterally. No edema or erythema noted. Capillary refill within normal limits.  Neurological: Epicritic and protective threshold absent bilaterally.   Musculoskeletal Exam: Range of motion within normal limits to all pedal and ankle joints bilateral. Muscle strength 5/5 in all groups bilateral.   Assessment: #1 left great toe ulcer secondary to diabetes mellitus #2 diabetes mellitus w/ peripheral neuropathy   Plan of Care:  #1 Patient was evaluated. #2 medically necessary excisional debridement including subcutaneous tissue was performed using a tissue nipper and a chisel blade. Excisional debridement of all the necrotic nonviable tissue down to healthy bleeding viable tissue was performed with post-debridement measurements same as pre-. #3 the wound was cleansed and dry sterile dressing applied. #4 prescription for gentamycin cream given to patient. #5 return to clinic in 2 weeks.   Edrick Kins, DPM [facility]  Dr. Edrick Kins, [place]                                        [place], [place] [address]                Office [phone]  Fax [phone]

## 2017-03-17 ENCOUNTER — Encounter: Payer: Self-pay | Admitting: Podiatry

## 2017-03-17 ENCOUNTER — Ambulatory Visit (INDEPENDENT_AMBULATORY_CARE_PROVIDER_SITE_OTHER): Payer: Medicare Other | Admitting: Podiatry

## 2017-03-17 DIAGNOSIS — L97522 Non-pressure chronic ulcer of other part of left foot with fat layer exposed: Secondary | ICD-10-CM | POA: Diagnosis not present

## 2017-03-17 DIAGNOSIS — E0842 Diabetes mellitus due to underlying condition with diabetic polyneuropathy: Secondary | ICD-10-CM

## 2017-03-17 DIAGNOSIS — I70245 Atherosclerosis of native arteries of left leg with ulceration of other part of foot: Secondary | ICD-10-CM

## 2017-03-28 NOTE — Progress Notes (Signed)
   Subjective:  Patient with a history of diabetes mellitus presents today for follow-up evaluation of the great toe ulceration to the left lower extremity. Patient believes there is a little bit of improvement. She is been using antibiotic and dressings daily. She states that it still pretty sore.   Objective/Physical Exam General: The patient is alert and oriented x3 in no acute distress.  Dermatology:  Wound #1 noted to the plantar aspect of the left great toe measuring approximately 001.001.001.001 cm (LxWxD).   To the noted ulceration(s), there is no eschar. There is a moderate amount of slough, fibrin, and necrotic tissue noted. Granulation tissue and wound base is red. There is a minimal amount of serosanguineous drainage noted. There is no exposed bone muscle-tendon ligament or joint. There is no malodor. Periwound integrity is intact. Skin is warm, dry and supple bilateral lower extremities.  Vascular: Palpable pedal pulses bilaterally. No edema or erythema noted. Capillary refill within normal limits.  Neurological: Epicritic and protective threshold absent bilaterally.   Musculoskeletal Exam: Range of motion within normal limits to all pedal and ankle joints bilateral. Muscle strength 5/5 in all groups bilateral.   Assessment: #1 left great toe ulceration secondary to diabetes mellitus #2 diabetes mellitus w/ peripheral neuropathy   Plan of Care:  #1 Patient was evaluated. #2 medically necessary excisional debridement including subcutaneous tissue was performed using a tissue nipper and a chisel blade. Excisional debridement of all the necrotic nonviable tissue down to healthy bleeding viable tissue was performed with post-debridement measurements same as pre-. #3 the wound was cleansed and dry sterile dressing applied. #4 continue gentamicin cream daily at home  #5 continue postoperative shoe  #6 patient is to return to clinic in 2 weeks.   Edrick Kins, DPM [facility]  Dr. Edrick Kins, [place]                                        [place], [place] [address]                Office [phone]  Fax [phone]

## 2017-04-07 ENCOUNTER — Ambulatory Visit: Payer: Medicare Other | Admitting: Podiatry

## 2017-04-12 ENCOUNTER — Ambulatory Visit (INDEPENDENT_AMBULATORY_CARE_PROVIDER_SITE_OTHER): Payer: Medicare Other | Admitting: Podiatry

## 2017-04-12 ENCOUNTER — Encounter: Payer: Self-pay | Admitting: Podiatry

## 2017-04-12 ENCOUNTER — Ambulatory Visit: Payer: Medicare Other | Admitting: Orthotics

## 2017-04-12 DIAGNOSIS — L97522 Non-pressure chronic ulcer of other part of left foot with fat layer exposed: Secondary | ICD-10-CM

## 2017-04-12 DIAGNOSIS — E0842 Diabetes mellitus due to underlying condition with diabetic polyneuropathy: Secondary | ICD-10-CM | POA: Diagnosis not present

## 2017-04-12 DIAGNOSIS — I70245 Atherosclerosis of native arteries of left leg with ulceration of other part of foot: Secondary | ICD-10-CM | POA: Diagnosis not present

## 2017-04-12 NOTE — Progress Notes (Signed)
   HPI: 66 year old female with a history of diabetes mellitus presents today for follow-up evaluation of left great toe ulceration. The patient has known diabetes with polyneuropathy lower extremities. Patient states that the pain has improved significantly over the past few weeks. She is been applying antibiotic ointment gentamicin daily with dry sterile dressing. She presents today for follow-up treatment and evaluation   Physical Exam: General: The patient is alert and oriented x3 in no acute distress.  Dermatology: Ulceration noted to the left great toe appears to be completely healed. Light debridement of overlying superficial eschar was performed and revealed healthy viable underlying skin. No open lesions. Skin is warm, dry and supple bilateral lower extremities. Negative for open lesions or macerations.  Vascular: Palpable pedal pulses bilaterally. No edema or erythema noted. Capillary refill within normal limits.  Neurological: Epicritic and protective threshold diminished bilaterally.   Musculoskeletal Exam: Range of motion within normal limits to all pedal and ankle joints bilateral. Muscle strength 5/5 in all groups bilateral.    Assessment: 1. Left great toe ulceration secondary to diabetes mellitus-resolved 2. 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 Patient was evaluated. 2. Light debridement was performed of the overlying loosely adhered eschar 3. Antibiotic ointment and a Band-Aid was applied 4. Today the patient was molded for custom molded diabetic shoes. 5. Return to clinic when necessary   Edrick Kins, DPM [facility]  Dr. Edrick Kins, DPM    [address], [place] [address]                Office [phone]  Fax [phone]

## 2017-04-15 ENCOUNTER — Other Ambulatory Visit: Payer: Self-pay | Admitting: Family Medicine

## 2017-04-15 DIAGNOSIS — E2839 Other primary ovarian failure: Secondary | ICD-10-CM

## 2017-04-23 ENCOUNTER — Ambulatory Visit
Admission: RE | Admit: 2017-04-23 | Discharge: 2017-04-23 | Disposition: A | Discharge status: Home / Self Care | Payer: Medicare Other | Source: Ambulatory Visit | Attending: Family Medicine | Admitting: Family Medicine

## 2017-04-23 DIAGNOSIS — E2839 Other primary ovarian failure: Secondary | ICD-10-CM

## 2017-05-06 ENCOUNTER — Ambulatory Visit (INDEPENDENT_AMBULATORY_CARE_PROVIDER_SITE_OTHER): Payer: Medicare Other | Admitting: Orthotics

## 2017-05-06 DIAGNOSIS — E08621 Diabetes mellitus due to underlying condition with foot ulcer: Secondary | ICD-10-CM

## 2017-05-06 DIAGNOSIS — L97521 Non-pressure chronic ulcer of other part of left foot limited to breakdown of skin: Secondary | ICD-10-CM | POA: Diagnosis not present

## 2017-05-06 DIAGNOSIS — L97522 Non-pressure chronic ulcer of other part of left foot with fat layer exposed: Secondary | ICD-10-CM

## 2017-05-06 DIAGNOSIS — E0842 Diabetes mellitus due to underlying condition with diabetic polyneuropathy: Secondary | ICD-10-CM

## 2017-05-06 NOTE — Progress Notes (Signed)
Patient came in today to pick up diabetic shoes and custom inserts.  Same was well pleased with fit and function.   The patient could ambulate without any discomfort; there were no signs of any quality issues. The foot ortheses offered full contact with plantar surface and contoured the arch well.   The shoes fit well with no heel slippage and areas of pressure concern.   Patient advised to contact us if any problems arise.  Patient also advised on how to report any issues.

## 2017-06-23 ENCOUNTER — Ambulatory Visit: Payer: Medicare Other | Admitting: Podiatry

## 2017-07-08 DEATH — deceased

## 2017-10-01 IMAGING — MR MR TOES*R* WO/W CM
5 of 10 series · 19 of 40 positions shown · IV contrast (multihance)
Comparison: Radiographs 04/13/2016.

CLINICAL DATA: Diabetic foot ulcer involving the second toe.

EXAM:
MRI OF THE RIGHT TOES WITHOUT AND WITH CONTRAST
TECHNIQUE: Multiplanar, multisequence MR imaging was performed both before and
after administration of intravenous contrast.
CONTRAST:  20mL MULTIHANCE GADOBENATE DIMEGLUMINE 529 MG/ML IV SOLN

[Series 5: T1 · coronal · 4.0mm · 0.29mm/px · 4 of 24 slices shown (1 of 2)]
[im 1/24]
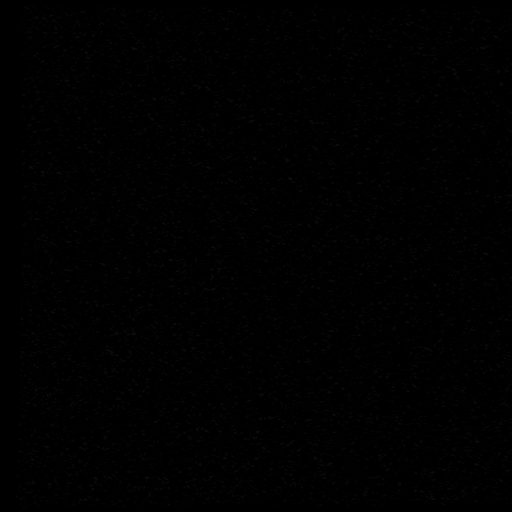
[im 8/24]
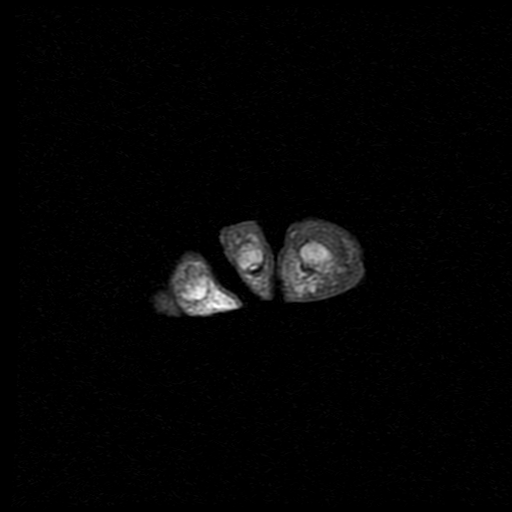
[im 16/24]
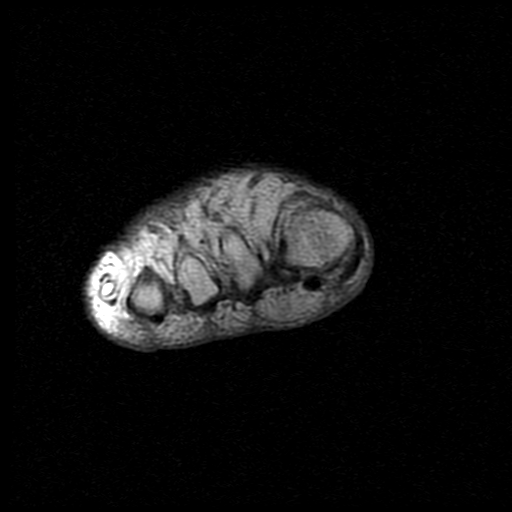
[im 24/24]
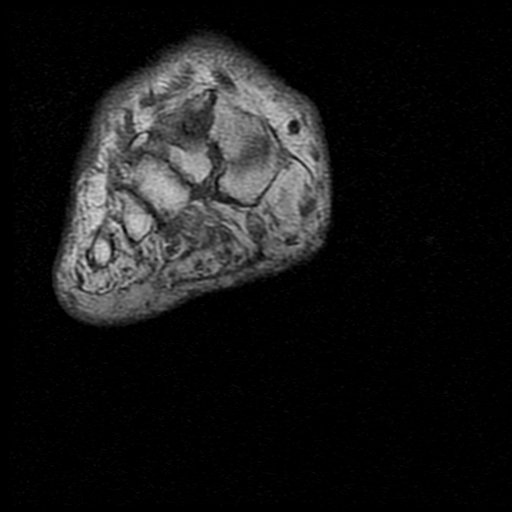

[Series 8: T1 · axial · 4.0mm · 0.29mm/px · z∈[-93,-10]mm · 4 of 18 slices shown (2 of 2)]
[im 1/18]
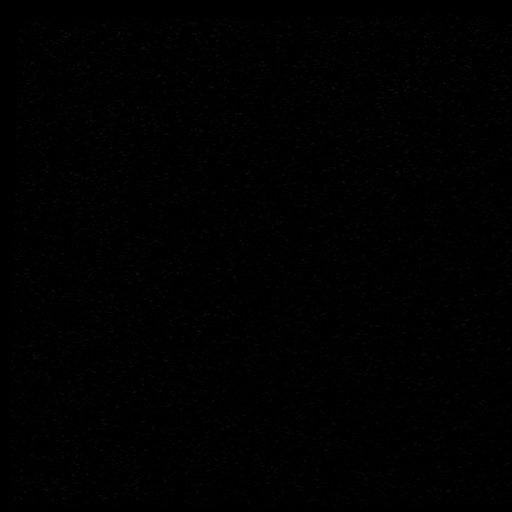
[im 6/18]
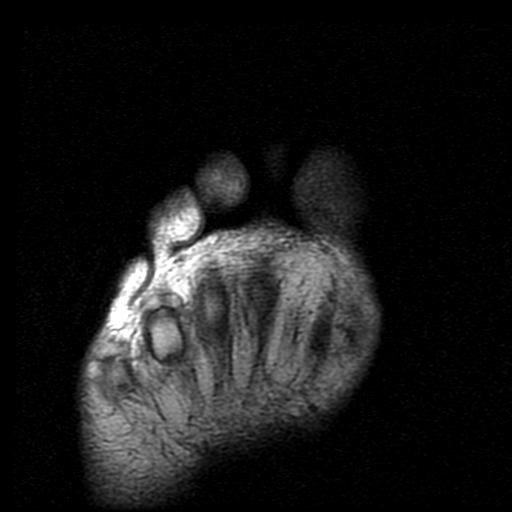
[im 12/18]
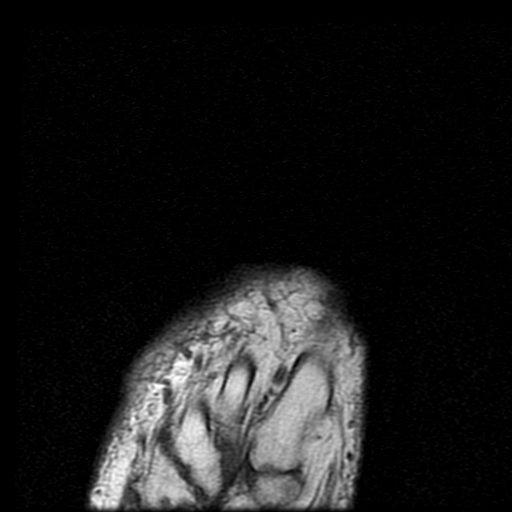
[im 18/18]
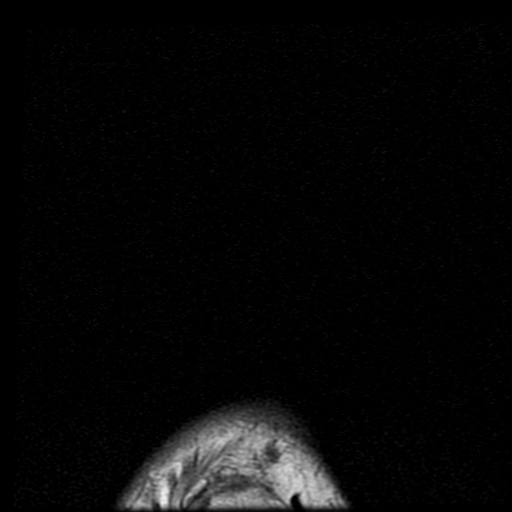

[Series 13: T1 fat-sat post-contrast · coronal · 4.0mm · 0.29mm/px · 5 of 24 slices shown]
[im 1/24]
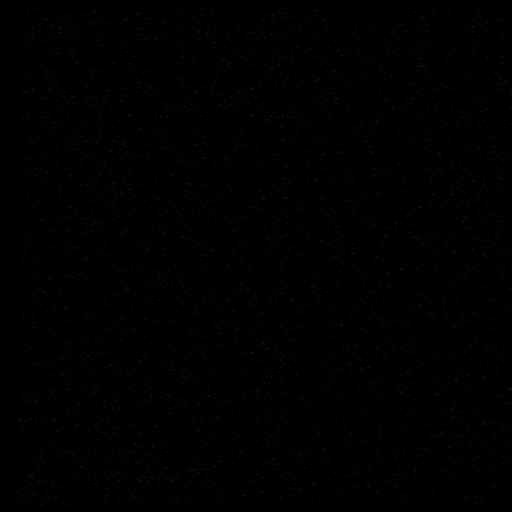
[im 6/24]
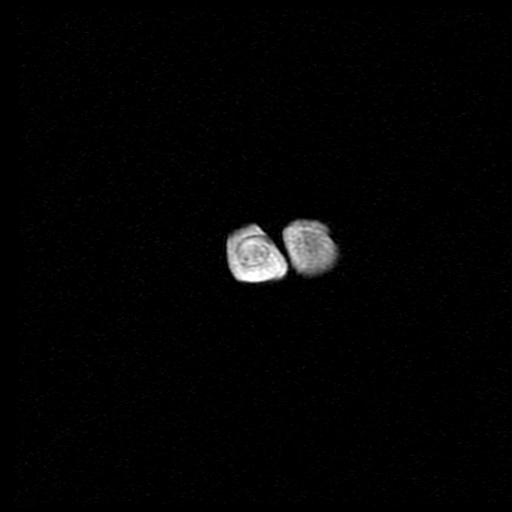
[im 12/24]
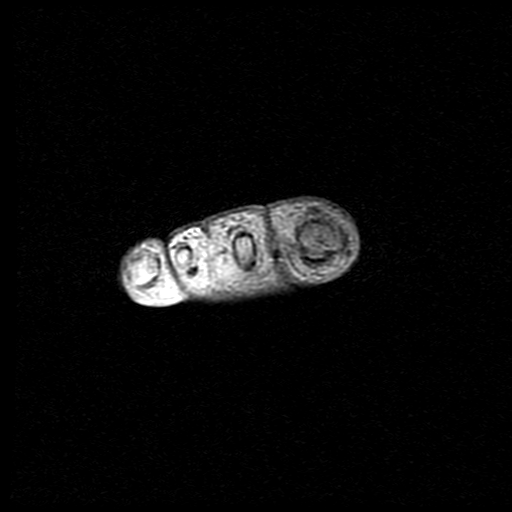
[im 18/24]
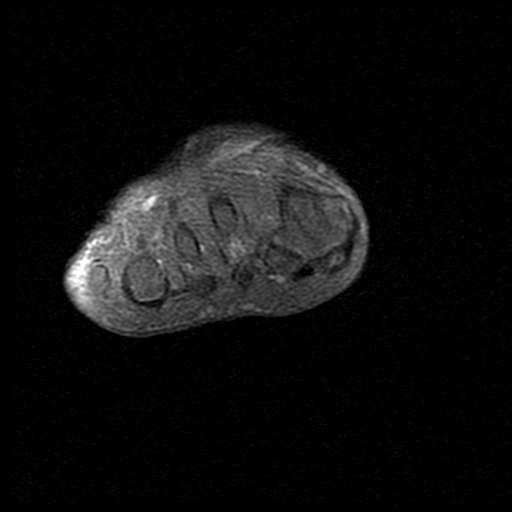
[im 24/24]
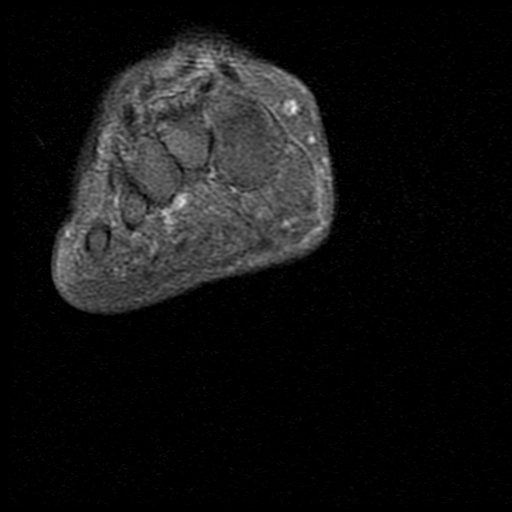

[Series 14: T1 post-contrast · axial · 4.0mm · 0.29mm/px · z∈[-93,-10]mm · 4 of 18 slices shown (1 of 2)]
[im 1/18]
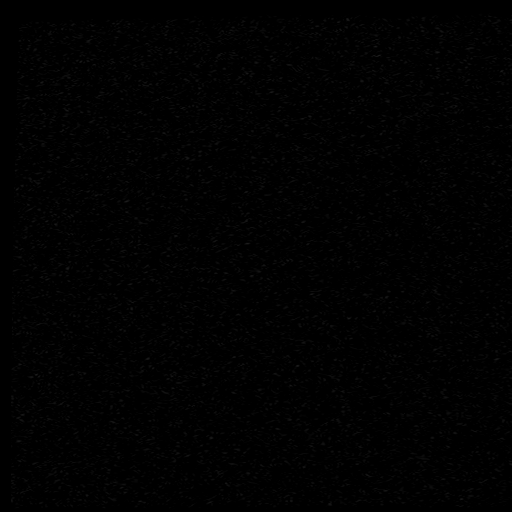
[im 6/18]
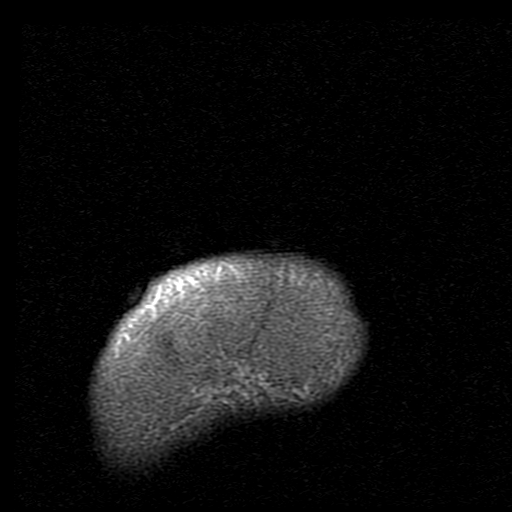
[im 12/18]
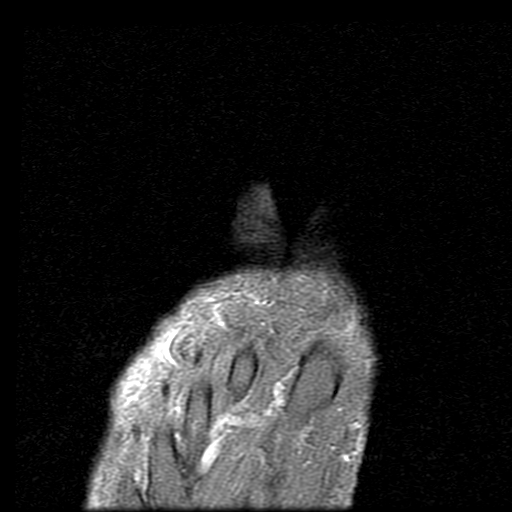
[im 18/18]
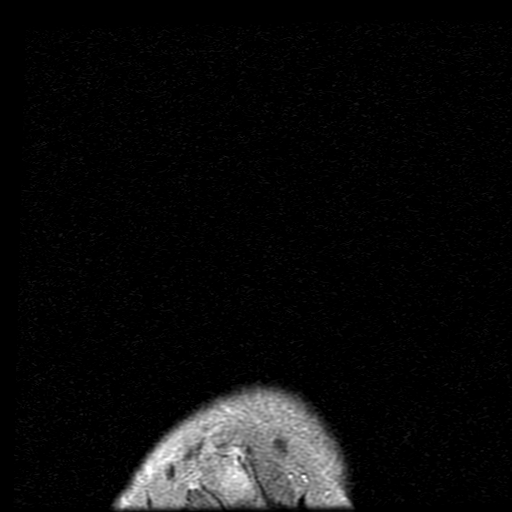

[Series 15: T1 post-contrast · sagittal · 4.0mm · 0.29mm/px · 2 of 20 slices shown (2 of 2)]
[im 1/20]
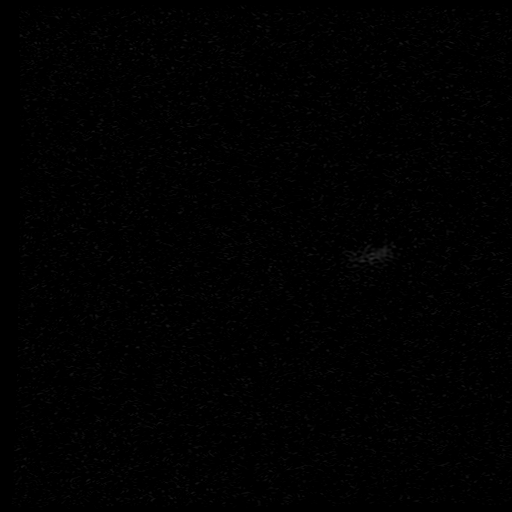
[im 7/20]
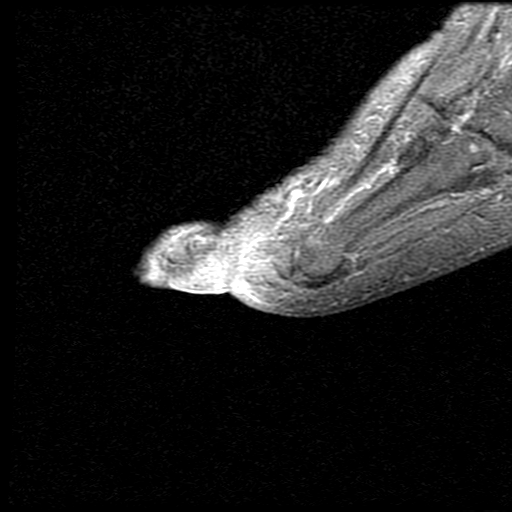

[19 of 40 positions shown; findings below may reference images not displayed]

FINDINGS: Abnormal T2 signal intensity in the distal phalanx of the second toe
along with suspected abnormal contrast enhancement is consistent
with osteomyelitis. No findings for septic arthritis. There is mild
cellulitis but no findings for myofasciitis. No discrete drainable
soft tissue abscess.
IMPRESSION: Filings consistent with osteomyelitis involving the distal phalanx
of the second toe.
